# Patient Record
Sex: Male | Born: 2000 | Race: Black or African American | Hispanic: No | Marital: Single | Smoking: Never smoker
Health system: Southern US, Community
[De-identification: ages and names within clinical notes are randomized; demographics above are authoritative.]

## PROBLEM LIST (undated history)

## (undated) DIAGNOSIS — J309 Allergic rhinitis, unspecified: Secondary | ICD-10-CM

## (undated) DIAGNOSIS — F909 Attention-deficit hyperactivity disorder, unspecified type: Secondary | ICD-10-CM

## (undated) DIAGNOSIS — F411 Generalized anxiety disorder: Secondary | ICD-10-CM

## (undated) DIAGNOSIS — F840 Autistic disorder: Secondary | ICD-10-CM

## (undated) HISTORY — DX: Attention-deficit hyperactivity disorder, unspecified type: F90.9

## (undated) HISTORY — DX: Autistic disorder: F84.0

## (undated) HISTORY — DX: Generalized anxiety disorder: F41.1

## (undated) HISTORY — DX: Allergic rhinitis, unspecified: J30.9

---
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
## 2020-02-15 ENCOUNTER — Other Ambulatory Visit: Payer: Self-pay

## 2020-02-15 ENCOUNTER — Ambulatory Visit
Admission: EM | Admit: 2020-02-15 | Discharge: 2020-02-15 | Disposition: A | Discharge status: Home / Self Care | Payer: No Typology Code available for payment source | Attending: Family Medicine | Admitting: Family Medicine

## 2020-02-15 DIAGNOSIS — R220 Localized swelling, mass and lump, head: Secondary | ICD-10-CM

## 2020-02-15 DIAGNOSIS — K0889 Other specified disorders of teeth and supporting structures: Secondary | ICD-10-CM

## 2020-02-15 DIAGNOSIS — R6884 Jaw pain: Secondary | ICD-10-CM

## 2020-02-15 DIAGNOSIS — K047 Periapical abscess without sinus: Secondary | ICD-10-CM | POA: Diagnosis not present

## 2020-02-15 MED ORDER — AMOXICILLIN-POT CLAVULANATE 875-125 MG PO TABS
1.0000 | ORAL_TABLET | Freq: Two times a day (BID) | ORAL | 0 refills | Status: AC
Start: 2020-02-15 — End: 2020-02-25

## 2020-02-15 NOTE — ED Triage Notes (Signed)
Pt presents with sore in mouth that developed yesterday

## 2020-02-15 NOTE — ED Provider Notes (Signed)
[facility]   [id number] 02/15/20 Arrival Time: 1427  CC: DENTAL pain  SUBJECTIVE:  Joshua Gilmore is a 19 y.o. male who presents with dental pain for the last day. Reports that his wisdom teeth are coming in. Reports that there is right sided facial pain. Denies a precipitating event or trauma. Has tried orajel with no relief. Worse with chewing. Does see a dentist regularly. Denies similar symptoms in the past. Denies fever, chills, dysphagia, odynophagia, oral or neck swelling, nausea, vomiting, chest pain, SOB.    ROS: As per HPI.  All other pertinent ROS negative.     Past Medical History:  Diagnosis Date  . ADHD (attention deficit hyperactivity disorder)   . Allergic rhinitis   . Autism spectrum disorder   . Generalized anxiety disorder 05/07/2013   No past surgical history on file. No Known Allergies No current facility-administered medications on file prior to encounter.   Current Outpatient Medications on File Prior to Encounter  Medication Sig Dispense Refill  . cetirizine (ZYRTEC) 10 MG tablet Take 1 tablet (10 mg total) by mouth daily. 30 tablet 5  . fluticasone (FLONASE) 50 MCG/ACT nasal spray Place 1-2 sprays into both nostrils daily. 16 g 0  . ibuprofen (ADVIL) 600 MG tablet Take 1 tablet (600 mg total) by mouth every 6 (six) hours as needed. 30 tablet 0  . ketoconazole (NIZORAL) 2 % shampoo APPLY DAILY FOR 2 WEEKS, MAY PUT ON FACE LATHER AND RINSE.  0  . mupirocin ointment (BACTROBAN) 2 % Apply 1 application topically 2 (two) times daily. 30 g 0  . olopatadine (PATANOL) 0.1 % ophthalmic solution Place 1 drop into both eyes 2 (two) times daily. 5 mL 0  . triamcinolone lotion (KENALOG) 0.1 % Apply 1 application topically 2 (two) times daily. 240 mL 5   Social History   Socioeconomic History  . Marital status: Single    Spouse name: Not on file  . Number of children: Not on file  . Years of education: Not on file  . Highest education level: Not on  file  Occupational History  . Not on file  Tobacco Use  . Smoking status: Never Smoker  . Smokeless tobacco: Never Used  Substance and Sexual Activity  . Alcohol use: Not on file  . Drug use: Not on file  . Sexual activity: Not on file  Other Topics Concern  . Not on file  Social History Narrative   Lives with mom,sister away at college   Social Determinants of Health   Financial Resource Strain:   . Difficulty of Paying Living Expenses:   Food Insecurity:   . Worried About Programme researcher, broadcasting/film/video in the Last Year:   . Barista in the Last Year:   Transportation Needs:   . Freight forwarder (Medical):   Marland Kitchen Lack of Transportation (Non-Medical):   Physical Activity:   . Days of Exercise per Week:   . Minutes of Exercise per Session:   Stress:   . Feeling of Stress :   Social Connections:   . Frequency of Communication with Friends and Family:   . Frequency of Social Gatherings with Friends and Family:   . Attends Religious Services:   . Active Member of Clubs or Organizations:   . Attends Banker Meetings:   Marland Kitchen Marital Status:   Intimate Partner Violence:   . Fear of Current or Ex-Partner:   . Emotionally Abused:   Marland Kitchen Physically Abused:   .  Sexually Abused:    Family History  Problem Relation Age of Onset  . Hypertension Mother   . Learning disabilities Sister     OBJECTIVE:  Vitals:   02/15/20 1438  BP: 115/75  Pulse: 91  Resp: 20  Temp: 99.6 F (37.6 C)  SpO2: 96%    General appearance: alert; no distress HENT: normocephalic; atraumatic; dentition: good; good dentition over right lower gums without areas of fluctuance, Wisdom tooth to R lower jaw appears to be partially erupted Neck: supple without LAD Lungs: normal respirations Skin: warm and dry Psychological: alert and cooperative; normal mood and affect  ASSESSMENT & PLAN:  1. Dental infection   2. Jaw pain   3. Pain, dental   4. Right facial swelling     Meds ordered  this encounter  Medications  . amoxicillin-clavulanate (AUGMENTIN) 875-125 MG tablet    Sig: Take 1 tablet by mouth 2 (two) times daily for 10 days.    Dispense:  20 tablet    Refill:  0    Order Specific Question:   Supervising Provider    Answer:   Merrilee Jansky [id number]     Augmentin prescribed Take ibuprofen or tylenol for pain as needed.   Use as directed for pain relief Recommend soft diet until evaluated by dentist Maintain oral hygiene care Follow up with dentist as soon as possible for further evaluation and treatment  Return or go to the ED if you have any new or worsening symptoms such as fever, chills, difficulty swallowing, painful swallowing, oral or neck swelling, nausea, vomiting, chest pain, SOB.  Reviewed expectations re: course of current medical issues. Questions answered. Outlined signs and symptoms indicating need for more acute intervention. Patient verbalized understanding. After Visit Summary given.   Moshe Cipro, NP 02/15/20 1527

## 2020-02-15 NOTE — Discharge Instructions (Signed)
I have sent in Augmentin for you to take twice daily for 10 days  May take ibuprofen and/or tylenol as needed for pain  Follow up with the dentist  May need to have wisdom teeth extracted

## 2020-03-06 ENCOUNTER — Telehealth: Payer: Self-pay | Admitting: Licensed Clinical Social Worker

## 2020-03-06 NOTE — Telephone Encounter (Addendum)
Joshua Gilmore was able to complete this referral: disregard   Mom called stating she was told pt would need referral to be dropped before 3pm today in order for him to keep his appointment with [facility].

## 2020-03-25 ENCOUNTER — Ambulatory Visit: Payer: Self-pay | Admitting: Pediatrics

## 2020-04-23 ENCOUNTER — Other Ambulatory Visit: Payer: Self-pay

## 2020-04-23 ENCOUNTER — Ambulatory Visit (INDEPENDENT_AMBULATORY_CARE_PROVIDER_SITE_OTHER): Payer: No Typology Code available for payment source | Admitting: Internal Medicine

## 2020-04-23 ENCOUNTER — Encounter: Payer: Self-pay | Admitting: Internal Medicine

## 2020-04-23 VITALS — BP 118/76 | HR 67 | Temp 97.2°F | Resp 18 | Ht 70.0 in | Wt 145.1 lb

## 2020-04-23 DIAGNOSIS — F840 Autistic disorder: Secondary | ICD-10-CM | POA: Diagnosis not present

## 2020-04-23 DIAGNOSIS — Z23 Encounter for immunization: Secondary | ICD-10-CM | POA: Diagnosis not present

## 2020-04-23 DIAGNOSIS — G4700 Insomnia, unspecified: Secondary | ICD-10-CM | POA: Diagnosis not present

## 2020-04-23 DIAGNOSIS — L659 Nonscarring hair loss, unspecified: Secondary | ICD-10-CM | POA: Diagnosis not present

## 2020-04-23 DIAGNOSIS — F909 Attention-deficit hyperactivity disorder, unspecified type: Secondary | ICD-10-CM

## 2020-04-23 DIAGNOSIS — Z7689 Persons encountering health services in other specified circumstances: Secondary | ICD-10-CM | POA: Diagnosis not present

## 2020-04-23 MED ORDER — DERMA-SMOOTHE/FS SCALP 0.01 % EX OIL: TOPICAL_OIL | CUTANEOUS | 1 refills | Status: DC

## 2020-04-23 NOTE — Assessment & Plan Note (Signed)
Follows up with [facility] Last evaluation in 10/2019, paperchart reviewed Advised to continue to follow up with Psychology Patient is on speech therapy currently Patient is independent with daily functioning

## 2020-04-23 NOTE — Patient Instructions (Addendum)
You are given Flu vaccine in the office today. It is normal to have soreness in the area for about 2 days. Continue to use the arm to prevent stiffness.  You are advised to continue to follow up with Behavioral health specialist and speech therapy.  Please get blood tests done within a week.  You advised to perform moderate exercise, at least 150 mins/week.  Please maintain dark and non-noisy environment in the bedroom at nighttime for better sleep. Try to take dinner at least 2 hours before bedtime. Please avoid use of electronic devices at bedtime. Okay to take Melatonin 5 mg tablets at nighttime.

## 2020-04-23 NOTE — Progress Notes (Signed)
New Patient Office Visit  Subjective:  Patient ID: Joshua Gilmore, male    DOB: [birth year]  Age: 19 y.o. MRN: [id number]  CC:  Chief Complaint  Patient presents with  . New Patient (Initial Visit)    new pt no concerns just establishing care would like refill on derma- smooth     HPI Joshua Gilmore is a 19 year old male with PMH of autism spectrum disorder and alopecia presents for establishing care. He states that he has been in good health overall. He used to visit Pediatrician till now. He also visits [facility] for autism spectrum disorder. He is currently enrolled in speech therapy as well. He currently works in a group home. He has graduated from high school. He is requesting a refill of his derma smoothe oil, which was given to help with hair loss and states that he has had good hair growth with it.  He complains of insomnia, difficulty maintaining sleep. He denies nervousness or flashbacks.  She denies headache, dizziness, neck pain, chest pain, dyspnea, palpitations, nausea, vomiting, abdominal pain, constipation, diarrhea, or urinary problems.  Patient is up-to-date with vaccinations, including COVID vaccines. He had flu vaccine in the office today.  Past Medical History:  Diagnosis Date  . ADHD (attention deficit hyperactivity disorder)   . Allergic rhinitis   . Autism spectrum disorder   . Generalized anxiety disorder 05/07/2013    History reviewed. No pertinent surgical history.  Family History  Problem Relation Age of Onset  . Hypertension Mother   . Learning disabilities Sister     Social History   Socioeconomic History  . Marital status: Single    Spouse name: Not on file  . Number of children: Not on file  . Years of education: Not on file  . Highest education level: Not on file  Occupational History  . 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 Smoking status: Never Smoker  . Smokeless tobacco: Never Used  Substance  and Sexual Activity  . Alcohol use: Never  . Drug use: Never  . Sexual activity: Not on file  Other Topics Concern  . Not on file  Social History Narrative   Lives with mom,sister away at college   Social Determinants of Health   Financial Resource Strain: Low Risk   . Difficulty of Paying Living Expenses: Not hard at all  Food Insecurity: No Food Insecurity  . Worried About Programme researcher, broadcasting/film/video in the Last Year: Never true  . Ran Out of Food in the Last Year: Never true  Transportation Needs: No Transportation Needs  . Lack of Transportation (Medical): No  . Lack of Transportation (Non-Medical): No  Physical Activity: Insufficiently Active  . Days of Exercise per Week: 3 days  . Minutes of Exercise per Session: 20 min  Stress: No Stress Concern Present  . Feeling of Stress : Not at all  Social Connections: Socially Isolated  . Frequency of Communication with Friends and Family: More than three times a week  . Frequency of Social Gatherings with Friends and Family: More than three times a week  . Attends Religious Services: Never  . Active Member of Clubs or Organizations: No  . Attends Banker Meetings: Never  . Marital Status: Never married  Intimate Partner Violence: Not At Risk  . Fear of Current or Ex-Partner: No  . Emotionally Abused: No  . Physically Abused: No  . Sexually Abused: No    ROS Review of  Systems  Constitutional: Negative for chills and fever.  HENT: Negative for congestion, postnasal drip, sinus pressure, sinus pain and sore throat.   Eyes: Negative for pain and discharge.  Respiratory: Negative for cough and shortness of breath.   Cardiovascular: Negative for chest pain and palpitations.  Gastrointestinal: Negative for constipation, diarrhea, nausea and vomiting.  Endocrine: Negative for polydipsia and polyuria.  Genitourinary: Negative for dysuria and hematuria.  Musculoskeletal: Negative for neck pain and neck stiffness.  Skin:  Negative for rash.  Neurological: Negative for dizziness, weakness, numbness and headaches.  Psychiatric/Behavioral: Negative for agitation and behavioral problems.    Objective:   Today's Vitals: BP 118/76 (BP Location: Right Arm, Patient Position: Sitting, Cuff Size: Normal)   Pulse 67   Temp (!) 97.2 F (36.2 C) (Temporal)   Resp 18   Ht 5\' 10"  (1.778 m)   Wt 145 lb 1.9 oz (65.8 kg)   SpO2 98%   BMI 20.82 kg/m   Physical Exam Vitals reviewed.  Constitutional:      General: He is not in acute distress.    Appearance: He is not diaphoretic.  HENT:     Head: Normocephalic and atraumatic.     Nose: Nose normal.     Mouth/Throat:     Mouth: Mucous membranes are moist.  Eyes:     General: No scleral icterus.    Extraocular Movements: Extraocular movements intact.     Pupils: Pupils are equal, round, and reactive to light.  Cardiovascular:     Rate and Rhythm: Normal rate and regular rhythm.     Pulses: Normal pulses.     Heart sounds: No murmur heard.   Pulmonary:     Breath sounds: Normal breath sounds. No wheezing or rales.  Abdominal:     Palpations: Abdomen is soft.     Tenderness: There is no abdominal tenderness.  Musculoskeletal:     Cervical back: Neck supple. No tenderness.     Right lower leg: No edema.     Left lower leg: No edema.  Skin:    General: Skin is warm.     Findings: No rash.  Neurological:     General: No focal deficit present.     Mental Status: He is alert and oriented to person, place, and time.     Cranial Nerves: No cranial nerve deficit.     Sensory: No sensory deficit.     Motor: No weakness.  Psychiatric:        Mood and Affect: Mood normal.        Behavior: Behavior normal.     Assessment & Plan:   Encounter to establish care Care established Previous chart reviewed History and medications reviewed with the patient  Autism spectrum disorder Follows up with [facility] Last evaluation in 10/2019, paperchart  reviewed Advised to continue to follow up with Psychology Patient is on speech therapy currently Patient is independent with daily functioning  ADHD Not on any pharmacotherapy Follows with [facility]  Alopecia Had patches of hair loss in the past, uses Derma smoothe oil, states that his hair growth is better with it Could be a sign of atopic dermatitis in the past, states he has history of seasonal allergies  Insomnia Discussed behavioral modification to help with sleep Material provided     Other Visit Diagnoses    Need for immunization against influenza    -  Primary   Relevant Orders   Flu Vaccine QUAD 36+ mos IM (Completed)  Outpatient Encounter Medications as of 04/23/2020  Medication Sig  . DERMA-SMOOTHE/FS SCALP 0.01 % OIL Apply topically at nighttime.  . [DISCONTINUED] DERMA-SMOOTHE/FS SCALP 0.01 % OIL Apply topically at bedtime as needed.  . [DISCONTINUED] cetirizine (ZYRTEC) 10 MG tablet Take 1 tablet (10 mg total) by mouth daily. (Patient not taking: Reported on 04/23/2020)  . [DISCONTINUED] fluticasone (FLONASE) 50 MCG/ACT nasal spray Place 1-2 sprays into both nostrils daily. (Patient not taking: Reported on 04/23/2020)  . [DISCONTINUED] ibuprofen (ADVIL) 600 MG tablet Take 1 tablet (600 mg total) by mouth every 6 (six) hours as needed. (Patient not taking: Reported on 04/23/2020)  . [DISCONTINUED] ketoconazole (NIZORAL) 2 % shampoo APPLY DAILY FOR 2 WEEKS, MAY PUT ON FACE LATHER AND RINSE. (Patient not taking: Reported on 04/23/2020)  . [DISCONTINUED] mupirocin ointment (BACTROBAN) 2 % Apply 1 application topically 2 (two) times daily. (Patient not taking: Reported on 04/23/2020)  . [DISCONTINUED] olopatadine (PATANOL) 0.1 % ophthalmic solution Place 1 drop into both eyes 2 (two) times daily. (Patient not taking: Reported on 04/23/2020)  . [DISCONTINUED] triamcinolone lotion (KENALOG) 0.1 % Apply 1 application topically 2 (two) times daily. (Patient not taking:  Reported on 04/23/2020)   No facility-administered encounter medications on file as of 04/23/2020.    Follow-up: Return in about 6 months (around 10/22/2020).   Anabel Halon, MD

## 2020-04-23 NOTE — Assessment & Plan Note (Signed)
Care established Previous chart reviewed History and medications reviewed with the patient 

## 2020-04-23 NOTE — Assessment & Plan Note (Signed)
Had patches of hair loss in the past, uses Derma smoothe oil, states that his hair growth is better with it Could be a sign of atopic dermatitis in the past, states he has history of seasonal allergies

## 2020-04-23 NOTE — Assessment & Plan Note (Signed)
Discussed behavioral modification to help with sleep Material provided

## 2020-04-23 NOTE — Assessment & Plan Note (Signed)
Not on any pharmacotherapy Follows with [facility]

## 2020-05-03 LAB — CBC WITH DIFFERENTIAL/PLATELET
Basophils Absolute: 0 10*3/uL (ref 0.0–0.2)
Basos: 1 %
EOS (ABSOLUTE): 0.1 10*3/uL (ref 0.0–0.4)
Eos: 2 %
Hematocrit: 47.7 % (ref 37.5–51.0)
Hemoglobin: 14.9 g/dL (ref 13.0–17.7)
Immature Grans (Abs): 0 10*3/uL (ref 0.0–0.1)
Immature Granulocytes: 0 %
Lymphocytes Absolute: 1.1 10*3/uL (ref 0.7–3.1)
Lymphs: 28 %
MCH: 25.8 pg — ABNORMAL LOW (ref 26.6–33.0)
MCHC: 31.2 g/dL — ABNORMAL LOW (ref 31.5–35.7)
MCV: 83 fL (ref 79–97)
Monocytes Absolute: 0.3 10*3/uL (ref 0.1–0.9)
Monocytes: 7 %
Neutrophils Absolute: 2.5 10*3/uL (ref 1.4–7.0)
Neutrophils: 62 %
Platelets: 353 10*3/uL (ref 150–450)
RBC: 5.78 x10E6/uL (ref 4.14–5.80)
RDW: 13.3 % (ref 11.6–15.4)
WBC: 4 10*3/uL (ref 3.4–10.8)

## 2020-05-03 LAB — LIPID PANEL
Chol/HDL Ratio: 2.6 ratio (ref 0.0–5.0)
Cholesterol, Total: 160 mg/dL (ref 100–169)
HDL: 62 mg/dL (ref >39)
LDL Chol Calc (NIH): 87 mg/dL (ref 0–109)
Triglycerides: 52 mg/dL (ref 0–89)
VLDL Cholesterol Cal: 11 mg/dL (ref 5–40)

## 2020-05-03 LAB — CMP14+EGFR
ALT: 16 IU/L (ref 0–44)
AST: 22 IU/L (ref 0–40)
Albumin/Globulin Ratio: 2 (ref 1.2–2.2)
Albumin: 5.1 g/dL (ref 4.1–5.2)
Alkaline Phosphatase: 74 IU/L (ref 51–125)
BUN/Creatinine Ratio: 12 (ref 9–20)
BUN: 12 mg/dL (ref 6–20)
Bilirubin Total: 1.5 mg/dL — ABNORMAL HIGH (ref 0.0–1.2)
CO2: 25 mmol/L (ref 20–29)
Calcium: 10.1 mg/dL (ref 8.7–10.2)
Chloride: 101 mmol/L (ref 96–106)
Creatinine, Ser: 0.98 mg/dL (ref 0.76–1.27)
GFR calc Af Amer: 129 mL/min/{1.73_m2} (ref >59)
GFR calc non Af Amer: 111 mL/min/{1.73_m2} (ref >59)
Globulin, Total: 2.6 g/dL (ref 1.5–4.5)
Glucose: 93 mg/dL (ref 65–99)
Potassium: 4.5 mmol/L (ref 3.5–5.2)
Sodium: 141 mmol/L (ref 134–144)
Total Protein: 7.7 g/dL (ref 6.0–8.5)

## 2020-05-03 LAB — T4 AND TSH
T4, Total: 6.9 ug/dL (ref 4.5–12.0)
TSH: 1.1 u[IU]/mL (ref 0.450–4.500)

## 2020-08-22 ENCOUNTER — Ambulatory Visit (INDEPENDENT_AMBULATORY_CARE_PROVIDER_SITE_OTHER): Payer: No Typology Code available for payment source | Admitting: Internal Medicine

## 2020-08-22 ENCOUNTER — Other Ambulatory Visit: Payer: Self-pay

## 2020-08-22 ENCOUNTER — Encounter: Payer: Self-pay | Admitting: Internal Medicine

## 2020-08-22 VITALS — BP 139/81 | HR 62 | Temp 97.8°F | Ht 70.0 in | Wt 149.0 lb

## 2020-08-22 DIAGNOSIS — S46911A Strain of unspecified muscle, fascia and tendon at shoulder and upper arm level, right arm, initial encounter: Secondary | ICD-10-CM

## 2020-08-22 DIAGNOSIS — M25511 Pain in right shoulder: Secondary | ICD-10-CM

## 2020-08-22 MED ORDER — IBUPROFEN 600 MG PO TABS: 600.0000 mg | ORAL_TABLET | Freq: Three times a day (TID) | ORAL | 0 refills | Status: DC | PRN

## 2020-08-22 MED ORDER — KETOROLAC TROMETHAMINE 60 MG/2ML IM SOLN
60.0000 mg | Freq: Once | INTRAMUSCULAR | Status: AC
  Administered 2020-08-22: 60 mg via INTRAMUSCULAR

## 2020-08-22 NOTE — Progress Notes (Signed)
Acute Office Visit  Subjective:    Patient ID: Joshua Gilmore, male    DOB: [birth year], 20 y.o.   MRN: [id number]  Chief Complaint  Patient presents with  . Shoulder Pain    R shoulder pain, x3-4 days, no known injury.    HPI Patient is in today for evaluation of right shoulder pain, which started about 2 days ago. He states that he woke up with right shoulder pain, which is 7-8/10, constant and radiates to his arm. He has difficulty lifting arm beyond 90 degrees. Denies any recent injury. Denies numbness or tingling in the UE. Denies neck pain.  Past Medical History:  Diagnosis Date  . ADHD (attention deficit hyperactivity disorder)   . Allergic rhinitis   . Autism spectrum disorder   . Generalized anxiety disorder 05/07/2013    No past surgical history on file.  Family History  Problem Relation Age of Onset  . Hypertension Mother   . Learning disabilities Sister     Social History   Socioeconomic History  . Marital status: Single    Spouse name: Not on file  . Number of children: Not on file  . Years of education: Not on file  . Highest education level: Not on file  Occupational History  . 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 Smoking status: Never Smoker  . Smokeless tobacco: Never Used  Substance and Sexual Activity  . Alcohol use: Never  . Drug use: Never  . Sexual activity: Not on file  Other Topics Concern  . Not on file  Social History Narrative   Lives with mom,sister away at college   Social Determinants of Health   Financial Resource Strain: Low Risk   . Difficulty of Paying Living Expenses: Not hard at all  Food Insecurity: No Food Insecurity  . Worried About Programme researcher, broadcasting/film/video in the Last Year: Never true  . Ran Out of Food in the Last Year: Never true  Transportation Needs: No Transportation Needs  . Lack of Transportation (Medical): No  . Lack of Transportation (Non-Medical): No  Physical Activity:  Insufficiently Active  . Days of Exercise per Week: 3 days  . Minutes of Exercise per Session: 20 min  Stress: No Stress Concern Present  . Feeling of Stress : Not at all  Social Connections: Socially Isolated  . Frequency of Communication with Friends and Family: More than three times a week  . Frequency of Social Gatherings with Friends and Family: More than three times a week  . Attends Religious Services: Never  . Active Member of Clubs or Organizations: No  . Attends Banker Meetings: Never  . Marital Status: Never married  Intimate Partner Violence: Not At Risk  . Fear of Current or Ex-Partner: No  . Emotionally Abused: No  . Physically Abused: No  . Sexually Abused: No    Outpatient Medications Prior to Visit  Medication Sig Dispense Refill  . DERMA-SMOOTHE/FS SCALP 0.01 % OIL Apply topically at nighttime. 118 mL 1   No facility-administered medications prior to visit.    No Known Allergies  Review of Systems  Constitutional: Negative for chills and fever.  Musculoskeletal: Negative for neck pain and neck stiffness.       Right shoulder pain  Neurological: Negative for weakness and numbness.       Objective:    Physical Exam Constitutional:      General: He is not in acute distress. HENT:  Head: Normocephalic and atraumatic.  Musculoskeletal:        General: No tenderness.     Comments: Painful ROM of right shoulder beyond 90 degrees  Neurological:     General: No focal deficit present.     Mental Status: He is alert and oriented to person, place, and time.     BP 139/81 (BP Location: Right Arm, Patient Position: Sitting, Cuff Size: Normal)   Pulse 62   Temp 97.8 F (36.6 C) (Temporal)   Ht 5\' 10"  (1.778 m)   Wt 149 lb (67.6 kg)   SpO2 96%   BMI 21.38 kg/m  Wt Readings from Last 3 Encounters:  08/22/20 149 lb (67.6 kg) (42 %, Z= -0.20)*  04/23/20 145 lb 1.9 oz (65.8 kg) (37 %, Z= -0.33)*  12/11/19 142 lb 2 oz (64.5 kg) (34 %, Z=  -0.40)*   * Growth percentiles are based on CDC (Boys, 2-20 Years) data.    Health Maintenance Due  Topic Date Due  . Hepatitis C Screening  Never done    There are no preventive care reminders to display for this patient.   Lab Results  Component Value Date   TSH 1.100 05/02/2020   Lab Results  Component Value Date   WBC 4.0 05/02/2020   HGB 14.9 05/02/2020   HCT 47.7 05/02/2020   MCV 83 05/02/2020   PLT 353 05/02/2020   Lab Results  Component Value Date   NA 141 05/02/2020   K 4.5 05/02/2020   CO2 25 05/02/2020   GLUCOSE 93 05/02/2020   BUN 12 05/02/2020   CREATININE 0.98 05/02/2020   BILITOT 1.5 (H) 05/02/2020   ALKPHOS 74 05/02/2020   AST 22 05/02/2020   ALT 16 05/02/2020   PROT 7.7 05/02/2020   ALBUMIN 5.1 05/02/2020   CALCIUM 10.1 05/02/2020   Lab Results  Component Value Date   CHOL 160 05/02/2020   Lab Results  Component Value Date   HDL 62 05/02/2020   Lab Results  Component Value Date   LDLCALC 87 05/02/2020   Lab Results  Component Value Date   TRIG 52 05/02/2020   Lab Results  Component Value Date   CHOLHDL 2.6 05/02/2020   No results found for: HGBA1C     Assessment & Plan:   Problem List Items Addressed This Visit   None   Visit Diagnoses    Acute pain of right shoulder    -  Primary No recent injury Appears to be muscle strain   Muscle strain, shoulder region, right, initial encounter     Toradol given Ibuprofen for short-term Heating pad or ice for pain/swelling  Work note provided.       No orders of the defined types were placed in this encounter.    05/04/2020, MD

## 2020-08-22 NOTE — Patient Instructions (Signed)
Please apply heating pads or ice over shoulder region to help with pain/swelling.  Please take Ibuprofen as prescribed for shoulder pain.

## 2020-09-01 ENCOUNTER — Encounter: Payer: Self-pay | Admitting: Internal Medicine

## 2020-09-01 ENCOUNTER — Other Ambulatory Visit: Payer: Self-pay

## 2020-09-01 ENCOUNTER — Ambulatory Visit (INDEPENDENT_AMBULATORY_CARE_PROVIDER_SITE_OTHER): Payer: No Typology Code available for payment source | Admitting: Internal Medicine

## 2020-09-01 VITALS — BP 136/75 | HR 67 | Temp 98.1°F | Resp 18 | Ht 71.0 in | Wt 151.4 lb

## 2020-09-01 DIAGNOSIS — H6092 Unspecified otitis externa, left ear: Secondary | ICD-10-CM | POA: Diagnosis not present

## 2020-09-01 DIAGNOSIS — L659 Nonscarring hair loss, unspecified: Secondary | ICD-10-CM

## 2020-09-01 MED ORDER — CARBAMIDE PEROXIDE 6.5 % OT SOLN: 5.0000 [drp] | Freq: Two times a day (BID) | OTIC | 0 refills | Status: DC

## 2020-09-01 MED ORDER — CIPROFLOXACIN-DEXAMETHASONE 0.3-0.1 % OT SUSP: 4.0000 [drp] | Freq: Two times a day (BID) | OTIC | 0 refills | Status: DC

## 2020-09-01 NOTE — Patient Instructions (Signed)
Please apply ear drops as prescribed.  Please avoid using any sharp objects to clean ear wax.  Okay to clean with warm water.

## 2020-09-01 NOTE — Progress Notes (Signed)
Acute Office Visit  Subjective:    Patient ID: Joshua Gilmore, male    DOB: 01-12-2001, 20 y.o.   MRN: [id number]  Chief Complaint  Patient presents with  . Ear Pain    Left ear stopped up has been stopped up for about 2 days thinks its wax build up     HPI Patient is in today for evaluation of left ear fullness and yellowish discharge along with left ear pain. He denies any hearing difficulty, tinnitus, pain behind the left ear, or imbalance. He denies any injury or inserting any sharp objects into left ear. Denies any fever or chills. Denies nasal congestion, sinus pain/pressure or sore throat.  Past Medical History:  Diagnosis Date  . ADHD (attention deficit hyperactivity disorder)   . Allergic rhinitis   . Autism spectrum disorder   . Generalized anxiety disorder 05/07/2013    History reviewed. No pertinent surgical history.  Family History  Problem Relation Age of Onset  . Hypertension Mother   . Learning disabilities Sister     Social History   Socioeconomic History  . Marital status: Single    Spouse name: Not on file  . Number of children: Not on file  . Years of education: Not on file  . Highest education level: Not on file  Occupational History  . 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 Smoking status: Never Smoker  . Smokeless tobacco: Never Used  Substance and Sexual Activity  . Alcohol use: Never  . Drug use: Never  . Sexual activity: Not on file  Other Topics Concern  . Not on file  Social History Narrative   Lives with mom,sister away at college   Social Determinants of Health   Financial Resource Strain: Low Risk   . Difficulty of Paying Living Expenses: Not hard at all  Food Insecurity: No Food Insecurity  . Worried About Programme researcher, broadcasting/film/video in the Last Year: Never true  . Ran Out of Food in the Last Year: Never true  Transportation Needs: No Transportation Needs  . Lack of Transportation (Medical): No  . Lack  of Transportation (Non-Medical): No  Physical Activity: Insufficiently Active  . Days of Exercise per Week: 3 days  . Minutes of Exercise per Session: 20 min  Stress: No Stress Concern Present  . Feeling of Stress : Not at all  Social Connections: Socially Isolated  . Frequency of Communication with Friends and Family: More than three times a week  . Frequency of Social Gatherings with Friends and Family: More than three times a week  . Attends Religious Services: Never  . Active Member of Clubs or Organizations: No  . Attends Banker Meetings: Never  . Marital Status: Never married  Intimate Partner Violence: Not At Risk  . Fear of Current or Ex-Partner: No  . Emotionally Abused: No  . Physically Abused: No  . Sexually Abused: No    Outpatient Medications Prior to Visit  Medication Sig Dispense Refill  . DERMA-SMOOTHE/FS SCALP 0.01 % OIL Apply topically at nighttime. 118 mL 1  . ibuprofen (ADVIL) 600 MG tablet Take 1 tablet (600 mg total) by mouth every 8 (eight) hours as needed. 20 tablet 0   No facility-administered medications prior to visit.    No Known Allergies  Review of Systems  Constitutional: Negative for chills and fever.  HENT: Positive for ear discharge and ear pain. Negative for congestion and sore throat.   Eyes:  Negative for pain and discharge.  Respiratory: Negative for cough and shortness of breath.   Cardiovascular: Negative for chest pain and palpitations.  Musculoskeletal: Negative for neck pain and neck stiffness.  Skin: Negative for rash.  Neurological: Negative for dizziness, weakness, numbness and headaches.  Psychiatric/Behavioral: Negative for agitation and behavioral problems.       Objective:    Physical Exam Vitals reviewed.  Constitutional:      General: He is not in acute distress.    Appearance: He is not diaphoretic.  HENT:     Head: Normocephalic and atraumatic.     Right Ear: Tympanic membrane, ear canal and  external ear normal. There is no impacted cerumen.     Ears:     Comments: Left ear canal erythematous, with excess cerumen Tenderness over tragal area    Nose: Nose normal.     Mouth/Throat:     Mouth: Mucous membranes are moist.  Eyes:     General: No scleral icterus.    Extraocular Movements: Extraocular movements intact.     Pupils: Pupils are equal, round, and reactive to light.  Cardiovascular:     Rate and Rhythm: Normal rate and regular rhythm.     Heart sounds: Normal heart sounds. No murmur heard.   Abdominal:     Palpations: Abdomen is soft.  Musculoskeletal:     Cervical back: Neck supple. No tenderness.  Skin:    General: Skin is warm.     Findings: No rash.  Neurological:     General: No focal deficit present.     Mental Status: He is alert and oriented to person, place, and time.  Psychiatric:        Mood and Affect: Mood normal.        Behavior: Behavior normal.     BP 136/75 (BP Location: Right Arm, Patient Position: Sitting, Cuff Size: Normal)   Pulse 67   Temp 98.1 F (36.7 C) (Oral)   Resp 18   Ht 5\' 11"  (1.803 m)   Wt 151 lb 6.4 oz (68.7 kg)   SpO2 99%   BMI 21.12 kg/m  Wt Readings from Last 3 Encounters:  09/01/20 151 lb 6.4 oz (68.7 kg) (46 %, Z= -0.11)*  08/22/20 149 lb (67.6 kg) (42 %, Z= -0.20)*  04/23/20 145 lb 1.9 oz (65.8 kg) (37 %, Z= -0.33)*   * Growth percentiles are based on CDC (Boys, 2-20 Years) data.    Health Maintenance Due  Topic Date Due  . Hepatitis C Screening  Never done    There are no preventive care reminders to display for this patient.   Lab Results  Component Value Date   TSH 1.100 05/02/2020   Lab Results  Component Value Date   WBC 4.0 05/02/2020   HGB 14.9 05/02/2020   HCT 47.7 05/02/2020   MCV 83 05/02/2020   PLT 353 05/02/2020   Lab Results  Component Value Date   NA 141 05/02/2020   K 4.5 05/02/2020   CO2 25 05/02/2020   GLUCOSE 93 05/02/2020   BUN 12 05/02/2020   CREATININE 0.98  05/02/2020   BILITOT 1.5 (H) 05/02/2020   ALKPHOS 74 05/02/2020   AST 22 05/02/2020   ALT 16 05/02/2020   PROT 7.7 05/02/2020   ALBUMIN 5.1 05/02/2020   CALCIUM 10.1 05/02/2020   Lab Results  Component Value Date   CHOL 160 05/02/2020   Lab Results  Component Value Date   HDL 62 05/02/2020  Lab Results  Component Value Date   LDLCALC 87 05/02/2020   Lab Results  Component Value Date   TRIG 52 05/02/2020   Lab Results  Component Value Date   CHOLHDL 2.6 05/02/2020   No results found for: HGBA1C     Assessment & Plan:   Problem List Items Addressed This Visit   None   Visit Diagnoses    Otitis externa of left ear, unspecified chronicity, unspecified type    -  Primary Ear drops prescribed Advised to clean ears with warm water only, avoid inserting any objects   Relevant Medications   carbamide peroxide (DEBROX) 6.5 % OTIC solution   ciprofloxacin-dexamethasone (CIPRODEX) OTIC suspension   Hair loss    Prefers a referral to Dermatology    Relevant Orders   Ambulatory referral to Dermatology       Meds ordered this encounter  Medications  . carbamide peroxide (DEBROX) 6.5 % OTIC solution    Sig: Place 5 drops into the left ear 2 (two) times daily.    Dispense:  15 mL    Refill:  0  . ciprofloxacin-dexamethasone (CIPRODEX) OTIC suspension    Sig: Place 4 drops into the left ear 2 (two) times daily.    Dispense:  7.5 mL    Refill:  0     Khalia Gong Concha Se, MD

## 2020-10-22 ENCOUNTER — Encounter: Payer: Self-pay | Admitting: Internal Medicine

## 2020-10-22 ENCOUNTER — Ambulatory Visit (INDEPENDENT_AMBULATORY_CARE_PROVIDER_SITE_OTHER): Payer: No Typology Code available for payment source | Admitting: Internal Medicine

## 2020-10-22 ENCOUNTER — Other Ambulatory Visit: Payer: Self-pay

## 2020-10-22 VITALS — BP 129/78 | HR 71 | Temp 99.2°F | Resp 16 | Ht 72.0 in | Wt 145.1 lb

## 2020-10-22 DIAGNOSIS — F840 Autistic disorder: Secondary | ICD-10-CM | POA: Diagnosis not present

## 2020-10-22 DIAGNOSIS — Z1159 Encounter for screening for other viral diseases: Secondary | ICD-10-CM | POA: Diagnosis not present

## 2020-10-22 DIAGNOSIS — Z0000 Encounter for general adult medical examination without abnormal findings: Secondary | ICD-10-CM | POA: Diagnosis not present

## 2020-10-22 DIAGNOSIS — L659 Nonscarring hair loss, unspecified: Secondary | ICD-10-CM | POA: Diagnosis not present

## 2020-10-22 NOTE — Patient Instructions (Signed)

## 2020-10-22 NOTE — Progress Notes (Signed)
Established Patient Office Visit  Subjective:  Patient ID: Joshua Gilmore, male    DOB: Aug 05, 2000  Age: 20 y.o. MRN: [id number]  CC:  Chief Complaint  Patient presents with  . Follow-up    6 month follow up     HPI Joshua Gilmore  is a 20 year old male with PMH of autism spectrum disorder and alopecia who presents for follow up of alopecia.  He states that his hair growth has been better with Derma-smoothe oil. He denies any itching or patches of hair loss currently.  He states that he still follows up with Psychologist.  Past Medical History:  Diagnosis Date  . ADHD (attention deficit hyperactivity disorder)   . Allergic rhinitis   . Autism spectrum disorder   . Generalized anxiety disorder 05/07/2013    History reviewed. No pertinent surgical history.  Family History  Problem Relation Age of Onset  . Hypertension Mother   . Learning disabilities Sister     Social History   Socioeconomic History  . Marital status: Single    Spouse name: Not on file  . Number of children: Not on file  . Years of education: Not on file  . Highest education level: Not on file  Occupational History  . 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 Smoking status: Never Smoker  . Smokeless tobacco: Never Used  Substance and Sexual Activity  . Alcohol use: Never  . Drug use: Never  . Sexual activity: Not on file  Other Topics Concern  . Not on file  Social History Narrative   Lives with mom,sister away at college   Social Determinants of Health   Financial Resource Strain: Low Risk   . Difficulty of Paying Living Expenses: Not hard at all  Food Insecurity: No Food Insecurity  . Worried About Charity fundraiser in the Last Year: Never true  . Ran Out of Food in the Last Year: Never true  Transportation Needs: No Transportation Needs  . Lack of Transportation (Medical): No  . Lack of Transportation (Non-Medical): No  Physical Activity:  Insufficiently Active  . Days of Exercise per Week: 3 days  . Minutes of Exercise per Session: 20 min  Stress: No Stress Concern Present  . Feeling of Stress : Not at all  Social Connections: Socially Isolated  . Frequency of Communication with Friends and Family: More than three times a week  . Frequency of Social Gatherings with Friends and Family: More than three times a week  . Attends Religious Services: Never  . Active Member of Clubs or Organizations: No  . Attends Archivist Meetings: Never  . Marital Status: Never married  Intimate Partner Violence: Not At Risk  . Fear of Current or Ex-Partner: No  . Emotionally Abused: No  . Physically Abused: No  . Sexually Abused: No    Outpatient Medications Prior to Visit  Medication Sig Dispense Refill  . carbamide peroxide (DEBROX) 6.5 % OTIC solution Place 5 drops into the left ear 2 (two) times daily. 15 mL 0  . DERMA-SMOOTHE/FS SCALP 0.01 % OIL Apply topically at nighttime. 118 mL 1  . ibuprofen (ADVIL) 600 MG tablet Take 1 tablet (600 mg total) by mouth every 8 (eight) hours as needed. 20 tablet 0  . ciprofloxacin-dexamethasone (CIPRODEX) OTIC suspension Place 4 drops into the left ear 2 (two) times daily. 7.5 mL 0   No facility-administered medications prior to visit.    No Known Allergies  ROS Review of Systems  Constitutional: Negative for chills and fever.  HENT: Negative for congestion, postnasal drip, sinus pressure, sinus pain and sore throat.   Eyes: Negative for pain and discharge.  Respiratory: Negative for cough and shortness of breath.   Cardiovascular: Negative for chest pain and palpitations.  Gastrointestinal: Negative for constipation, diarrhea, nausea and vomiting.  Endocrine: Negative for polydipsia and polyuria.  Genitourinary: Negative for dysuria and hematuria.  Musculoskeletal: Negative for neck pain and neck stiffness.  Skin: Negative for rash.  Neurological: Negative for dizziness,  weakness, numbness and headaches.  Psychiatric/Behavioral: Negative for agitation and behavioral problems.      Objective:    Physical Exam Vitals reviewed.  Constitutional:      General: He is not in acute distress.    Appearance: He is not diaphoretic.  HENT:     Head: Normocephalic and atraumatic.     Nose: Nose normal.     Mouth/Throat:     Mouth: Mucous membranes are moist.  Eyes:     General: No scleral icterus.    Extraocular Movements: Extraocular movements intact.  Cardiovascular:     Rate and Rhythm: Normal rate and regular rhythm.     Pulses: Normal pulses.     Heart sounds: Normal heart sounds. No murmur heard.   Pulmonary:     Breath sounds: Normal breath sounds. No wheezing or rales.  Musculoskeletal:     Cervical back: Neck supple. No tenderness.     Right lower leg: No edema.     Left lower leg: No edema.  Skin:    General: Skin is warm.     Findings: No rash.  Neurological:     General: No focal deficit present.     Mental Status: He is alert and oriented to person, place, and time.  Psychiatric:        Mood and Affect: Mood normal.        Behavior: Behavior normal.     BP 129/78 (BP Location: Right Arm, Patient Position: Sitting, Cuff Size: Normal)   Pulse 71   Temp 99.2 F (37.3 C) (Oral)   Resp 16   Ht 6' (1.829 m)   Wt 145 lb 1.9 oz (65.8 kg)   SpO2 98%   BMI 19.68 kg/m  Wt Readings from Last 3 Encounters:  10/22/20 145 lb 1.9 oz (65.8 kg) (34 %, Z= -0.40)*  09/01/20 151 lb 6.4 oz (68.7 kg) (46 %, Z= -0.11)*  08/22/20 149 lb (67.6 kg) (42 %, Z= -0.20)*   * Growth percentiles are based on CDC (Boys, 2-20 Years) data.     Health Maintenance Due  Topic Date Due  . Hepatitis C Screening  Never done    There are no preventive care reminders to display for this patient.  Lab Results  Component Value Date   TSH 1.100 05/02/2020   Lab Results  Component Value Date   WBC 4.0 05/02/2020   HGB 14.9 05/02/2020   HCT 47.7  05/02/2020   MCV 83 05/02/2020   PLT 353 05/02/2020   Lab Results  Component Value Date   NA 141 05/02/2020   K 4.5 05/02/2020   CO2 25 05/02/2020   GLUCOSE 93 05/02/2020   BUN 12 05/02/2020   CREATININE 0.98 05/02/2020   BILITOT 1.5 (H) 05/02/2020   ALKPHOS 74 05/02/2020   AST 22 05/02/2020   ALT 16 05/02/2020   PROT 7.7 05/02/2020   ALBUMIN 5.1 05/02/2020   CALCIUM 10.1 05/02/2020  Lab Results  Component Value Date   CHOL 160 05/02/2020   Lab Results  Component Value Date   HDL 62 05/02/2020   Lab Results  Component Value Date   LDLCALC 87 05/02/2020   Lab Results  Component Value Date   TRIG 52 05/02/2020   Lab Results  Component Value Date   CHOLHDL 2.6 05/02/2020   No results found for: HGBA1C    Assessment & Plan:   Problem List Items Addressed This Visit      Musculoskeletal and Integument   Alopecia - Primary    Had patches of hair loss in the past, uses Derma smoothe oil, states that his hair growth is better with it      Relevant Orders   CBC   CMP14+EGFR   TSH     Other   Autism spectrum disorder    Follows up with [facility] Advised to continue to follow up with Psychology Patient is on speech therapy currently Patient is independent with daily functioning       Other Visit Diagnoses    Need for hepatitis C screening test       Relevant Orders   Hepatitis C Antibody    No orders of the defined types were placed in this encounter.   Follow-up: Return in about 6 months (around 04/23/2021) for Annual physical.    Lindell Spar, MD

## 2020-10-22 NOTE — Assessment & Plan Note (Signed)
Had patches of hair loss in the past, uses Derma smoothe oil, states that his hair growth is better with it

## 2020-10-22 NOTE — Assessment & Plan Note (Signed)
Follows up with [facility] Advised to continue to follow up with Psychology Patient is on speech therapy currently Patient is independent with daily functioning

## 2020-12-10 ENCOUNTER — Encounter: Payer: Self-pay | Admitting: Internal Medicine

## 2020-12-10 ENCOUNTER — Ambulatory Visit (INDEPENDENT_AMBULATORY_CARE_PROVIDER_SITE_OTHER): Payer: No Typology Code available for payment source | Admitting: Internal Medicine

## 2020-12-10 ENCOUNTER — Other Ambulatory Visit: Payer: Self-pay

## 2020-12-10 VITALS — BP 135/84 | HR 79 | Temp 98.9°F | Resp 18 | Ht 72.0 in | Wt 144.1 lb

## 2020-12-10 DIAGNOSIS — M545 Low back pain, unspecified: Secondary | ICD-10-CM | POA: Diagnosis not present

## 2020-12-10 DIAGNOSIS — J302 Other seasonal allergic rhinitis: Secondary | ICD-10-CM | POA: Diagnosis not present

## 2020-12-10 MED ORDER — CYCLOBENZAPRINE HCL 5 MG PO TABS: 5.0000 mg | ORAL_TABLET | Freq: Three times a day (TID) | ORAL | 1 refills | Status: DC | PRN

## 2020-12-10 MED ORDER — CETIRIZINE HCL 10 MG PO TABS: 10.0000 mg | ORAL_TABLET | Freq: Every day | ORAL | 3 refills | Status: DC

## 2020-12-10 MED ORDER — IBUPROFEN 600 MG PO TABS: 600.0000 mg | ORAL_TABLET | Freq: Three times a day (TID) | ORAL | 0 refills | Status: DC | PRN

## 2020-12-10 NOTE — Progress Notes (Signed)
Acute Office Visit  Subjective:    Patient ID: Joshua Gilmore, male    DOB: April 04, 2001, 20 y.o.   MRN: [id number]  Chief Complaint  Patient presents with  . Back Pain    Pt spine has been hurting for about 2 weeks has not tried anything over the counter did try exercise is worse when he works     HPI Patient is in today for evaluation of lower back pain, which is present for last 2 weeks. Pain is constant, worse with bending and radiates to right leg at times. Denies any numbness, tingling or weakness of LE. His pain is worse at the end of his shift. Denies any recent injury or heavy lifting. He works in Art therapist. He has been exercising to help with the back pain.  He also c/o scratchy throat and dry cough recently. Denies any dyspnea or wheezing. He has h/o allergic rhinitis.  Past Medical History:  Diagnosis Date  . ADHD (attention deficit hyperactivity disorder)   . Allergic rhinitis   . Autism spectrum disorder   . Generalized anxiety disorder 05/07/2013    History reviewed. No pertinent surgical history.  Family History  Problem Relation Age of Onset  . Hypertension Mother   . Learning disabilities Sister     Social History   Socioeconomic History  . Marital status: Single    Spouse name: Not on file  . Number of children: Not on file  . Years of education: Not on file  . Highest education level: Not on file  Occupational History  . 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 Smoking status: Never Smoker  . Smokeless tobacco: Never Used  Substance and Sexual Activity  . Alcohol use: Never  . Drug use: Never  . Sexual activity: Not on file  Other Topics Concern  . Not on file  Social History Narrative   Lives with mom,sister away at college   Social Determinants of Health   Financial Resource Strain: Low Risk   . Difficulty of Paying Living Expenses: Not hard at all  Food Insecurity: No Food Insecurity  . Worried  About Programme researcher, broadcasting/film/video in the Last Year: Never true  . Ran Out of Food in the Last Year: Never true  Transportation Needs: No Transportation Needs  . Lack of Transportation (Medical): No  . Lack of Transportation (Non-Medical): No  Physical Activity: Insufficiently Active  . Days of Exercise per Week: 3 days  . Minutes of Exercise per Session: 20 min  Stress: No Stress Concern Present  . Feeling of Stress : Not at all  Social Connections: Socially Isolated  . Frequency of Communication with Friends and Family: More than three times a week  . Frequency of Social Gatherings with Friends and Family: More than three times a week  . Attends Religious Services: Never  . Active Member of Clubs or Organizations: No  . Attends Banker Meetings: Never  . Marital Status: Never married  Intimate Partner Violence: Not At Risk  . Fear of Current or Ex-Partner: No  . Emotionally Abused: No  . Physically Abused: No  . Sexually Abused: No    Outpatient Medications Prior to Visit  Medication Sig Dispense Refill  . carbamide peroxide (DEBROX) 6.5 % OTIC solution Place 5 drops into the left ear 2 (two) times daily. 15 mL 0  . DERMA-SMOOTHE/FS SCALP 0.01 % OIL Apply topically at nighttime. 118 mL 1  . ibuprofen (  ADVIL) 600 MG tablet Take 1 tablet (600 mg total) by mouth every 8 (eight) hours as needed. 20 tablet 0   No facility-administered medications prior to visit.    No Known Allergies  Review of Systems  Constitutional: Negative for chills and fever.  HENT: Positive for sore throat. Negative for congestion, postnasal drip, sinus pressure and sinus pain.   Eyes: Negative for pain and discharge.  Respiratory: Negative for cough and shortness of breath.   Cardiovascular: Negative for chest pain and palpitations.  Gastrointestinal: Negative for constipation, diarrhea, nausea and vomiting.  Endocrine: Negative for polydipsia and polyuria.  Genitourinary: Negative for dysuria and  hematuria.  Musculoskeletal: Positive for back pain. Negative for neck pain and neck stiffness.  Skin: Negative for rash.  Neurological: Negative for dizziness, weakness, numbness and headaches.  Psychiatric/Behavioral: Negative for agitation and behavioral problems.       Objective:    Physical Exam Vitals reviewed.  Constitutional:      General: He is not in acute distress.    Appearance: He is not diaphoretic.  HENT:     Head: Normocephalic and atraumatic.     Nose: Nose normal.     Mouth/Throat:     Mouth: Mucous membranes are moist.  Eyes:     General: No scleral icterus.    Extraocular Movements: Extraocular movements intact.  Cardiovascular:     Rate and Rhythm: Normal rate and regular rhythm.     Pulses: Normal pulses.     Heart sounds: Normal heart sounds. No murmur heard.   Pulmonary:     Breath sounds: Normal breath sounds. No wheezing or rales.  Abdominal:     Palpations: Abdomen is soft.     Tenderness: There is no abdominal tenderness.  Musculoskeletal:     Cervical back: Neck supple. No tenderness.     Right lower leg: No edema.     Left lower leg: No edema.  Skin:    General: Skin is warm.     Findings: No rash.  Neurological:     General: No focal deficit present.     Mental Status: He is alert and oriented to person, place, and time.  Psychiatric:        Mood and Affect: Mood normal.        Behavior: Behavior normal.     BP 135/84 (BP Location: Right Arm, Patient Position: Sitting, Cuff Size: Normal)   Pulse 79   Temp 98.9 F (37.2 C) (Oral)   Resp 18   Ht 6' (1.829 m)   Wt 144 lb 1.9 oz (65.4 kg)   SpO2 99%   BMI 19.55 kg/m  Wt Readings from Last 3 Encounters:  12/10/20 144 lb 1.9 oz (65.4 kg) (32 %, Z= -0.46)*  10/22/20 145 lb 1.9 oz (65.8 kg) (34 %, Z= -0.40)*  09/01/20 151 lb 6.4 oz (68.7 kg) (46 %, Z= -0.11)*   * Growth percentiles are based on CDC (Boys, 2-20 Years) data.    Health Maintenance Due  Topic Date Due  .  Hepatitis C Screening  Never done    There are no preventive care reminders to display for this patient.   Lab Results  Component Value Date   TSH 1.100 05/02/2020   Lab Results  Component Value Date   WBC 4.0 05/02/2020   HGB 14.9 05/02/2020   HCT 47.7 05/02/2020   MCV 83 05/02/2020   PLT 353 05/02/2020   Lab Results  Component Value Date   NA  141 05/02/2020   K 4.5 05/02/2020   CO2 25 05/02/2020   GLUCOSE 93 05/02/2020   BUN 12 05/02/2020   CREATININE 0.98 05/02/2020   BILITOT 1.5 (H) 05/02/2020   ALKPHOS 74 05/02/2020   AST 22 05/02/2020   ALT 16 05/02/2020   PROT 7.7 05/02/2020   ALBUMIN 5.1 05/02/2020   CALCIUM 10.1 05/02/2020   Lab Results  Component Value Date   CHOL 160 05/02/2020   Lab Results  Component Value Date   HDL 62 05/02/2020   Lab Results  Component Value Date   LDLCALC 87 05/02/2020   Lab Results  Component Value Date   TRIG 52 05/02/2020   Lab Results  Component Value Date   CHOLHDL 2.6 05/02/2020   No results found for: HGBA1C     Assessment & Plan:   Problem List Items Addressed This Visit      Other   Seasonal allergies    Started Zyrtec Advised to use humidifier at nighttime to avoid dry air      Relevant Medications   cetirizine (ZYRTEC) 10 MG tablet    Other Visit Diagnoses    Acute low back pain without sciatica, unspecified back pain laterality    -  Primary Nontraumatic, reports h/o scoliosis Check X-ray lumbar spine Ibuprofen PRN Flexeril for muscle spasms Simple back exercises Avoid heavy lifting or frequent bending Avoid prolonged standing at work if possible    Relevant Medications   ibuprofen (ADVIL) 600 MG tablet   cyclobenzaprine (FLEXERIL) 5 MG tablet   Other Relevant Orders   DG Lumbar Spine Complete       Meds ordered this encounter  Medications  . ibuprofen (ADVIL) 600 MG tablet    Sig: Take 1 tablet (600 mg total) by mouth every 8 (eight) hours as needed.    Dispense:  20 tablet     Refill:  0  . cyclobenzaprine (FLEXERIL) 5 MG tablet    Sig: Take 1 tablet (5 mg total) by mouth 3 (three) times daily as needed for muscle spasms.    Dispense:  30 tablet    Refill:  1  . cetirizine (ZYRTEC) 10 MG tablet    Sig: Take 1 tablet (10 mg total) by mouth daily.    Dispense:  30 tablet    Refill:  3     Severiano Utsey Concha Se, MD

## 2020-12-10 NOTE — Assessment & Plan Note (Signed)
Started Zyrtec Advised to use humidifier at nighttime to avoid dry air

## 2020-12-10 NOTE — Patient Instructions (Signed)
Acute Back Pain, Adult Acute back pain is sudden and usually short-lived. It is often caused by an injury to the muscles and tissues in the back. The injury may result from:  A muscle or ligament getting overstretched or torn (strained). Ligaments are tissues that connect bones to each other. Lifting something improperly can cause a back strain.  Wear and tear (degeneration) of the spinal disks. Spinal disks are circular tissue that provide cushioning between the bones of the spine (vertebrae).  Twisting motions, such as while playing sports or doing yard work.  A hit to the back.  Arthritis. You may have a physical exam, lab tests, and imaging tests to find the cause of your pain. Acute back pain usually goes away with rest and home care. Follow these instructions at home: Managing pain, stiffness, and swelling  Treatment may include medicines for pain and inflammation that are taken by mouth or applied to the skin, prescription pain medicine, or muscle relaxants. Take over-the-counter and prescription medicines only as told by your health care provider.  Your health care provider may recommend applying ice during the first 24-48 hours after your pain starts. To do this: ? Put ice in a plastic bag. ? Place a towel between your skin and the bag. ? Leave the ice on for 20 minutes, 2-3 times a day.  If directed, apply heat to the affected area as often as told by your health care provider. Use the heat source that your health care provider recommends, such as a moist heat pack or a heating pad. ? Place a towel between your skin and the heat source. ? Leave the heat on for 20-30 minutes. ? Remove the heat if your skin turns bright red. This is especially important if you are unable to feel pain, heat, or cold. You have a greater risk of getting burned. Activity  Do not stay in bed. Staying in bed for more than 1-2 days can delay your recovery.  Sit up and stand up straight. Avoid leaning  forward when you sit or hunching over when you stand. ? If you work at a desk, sit close to it so you do not need to lean over. Keep your chin tucked in. Keep your neck drawn back, and keep your elbows bent at a 90-degree angle (right angle). ? Sit high and close to the steering wheel when you drive. Add lower back (lumbar) support to your car seat, if needed.  Take short walks on even surfaces as soon as you are able. Try to increase the length of time you walk each day.  Do not sit, drive, or stand in one place for more than 30 minutes at a time. Sitting or standing for long periods of time can put stress on your back.  Do not drive or use heavy machinery while taking prescription pain medicine.  Use proper lifting techniques. When you bend and lift, use positions that put less stress on your back: ? Bend your knees. ? Keep the load close to your body. ? Avoid twisting.  Exercise regularly as told by your health care provider. Exercising helps your back heal faster and helps prevent back injuries by keeping muscles strong and flexible.  Work with a physical therapist to make a safe exercise program, as recommended by your health care provider. Do any exercises as told by your physical therapist.   Lifestyle  Maintain a healthy weight. Extra weight puts stress on your back and makes it difficult to have   good posture.  Avoid activities or situations that make you feel anxious or stressed. Stress and anxiety increase muscle tension and can make back pain worse. Learn ways to manage anxiety and stress, such as through exercise. General instructions  Sleep on a firm mattress in a comfortable position. Try lying on your side with your knees slightly bent. If you lie on your back, put a pillow under your knees.  Follow your treatment plan as told by your health care provider. This may include: ? Cognitive or behavioral therapy. ? Acupuncture or massage therapy. ? Meditation or yoga. Contact  a health care provider if:  You have pain that is not relieved with rest or medicine.  You have increasing pain going down into your legs or buttocks.  Your pain does not improve after 2 weeks.  You have pain at night.  You lose weight without trying.  You have a fever or chills. Get help right away if:  You develop new bowel or bladder control problems.  You have unusual weakness or numbness in your arms or legs.  You develop nausea or vomiting.  You develop abdominal pain.  You feel faint. Summary  Acute back pain is sudden and usually short-lived.  Use proper lifting techniques. When you bend and lift, use positions that put less stress on your back.  Take over-the-counter and prescription medicines and apply heat or ice as directed by your health care provider. This information is not intended to replace advice given to you by your health care provider. Make sure you discuss any questions you have with your health care provider. Document Revised: 03/28/2020 Document Reviewed: 03/28/2020 Elsevier Patient Education  2021 [employer]  

## 2021-01-26 ENCOUNTER — Encounter: Payer: Self-pay | Admitting: Pediatrics

## 2021-03-01 ENCOUNTER — Encounter: Payer: Self-pay | Admitting: Emergency Medicine

## 2021-03-01 ENCOUNTER — Ambulatory Visit
Admission: EM | Admit: 2021-03-01 | Discharge: 2021-03-01 | Disposition: A | Discharge status: Home / Self Care | Payer: No Typology Code available for payment source | Attending: Family Medicine | Admitting: Family Medicine

## 2021-03-01 DIAGNOSIS — S00411A Abrasion of right ear, initial encounter: Secondary | ICD-10-CM

## 2021-03-01 DIAGNOSIS — H60541 Acute eczematoid otitis externa, right ear: Secondary | ICD-10-CM

## 2021-03-01 MED ORDER — PREDNISONE 10 MG PO TABS: 10.0000 mg | ORAL_TABLET | Freq: Every day | ORAL | 0 refills | Status: AC

## 2021-03-01 MED ORDER — HYDROCORTISONE 1 % EX OINT: 1.0000 "application " | TOPICAL_OINTMENT | Freq: Two times a day (BID) | CUTANEOUS | 0 refills | Status: DC | PRN

## 2021-03-01 NOTE — ED Notes (Signed)
Pt's mother ask to speak with provider again when attempting to d/c pt

## 2021-03-01 NOTE — Discharge Instructions (Addendum)
Take prednisone 10 mg daily for 5 days. Avoid scratching inside of the your ear. Once scratch inside of the ear heals and you experience any itching in the future, apply topical cortisone ointment to external canal of ear only. Taking cetrizine daily can reduce inner ear itching

## 2021-03-01 NOTE — ED Provider Notes (Signed)
[facility]    CSN: [id number] Arrival date & time: 03/01/21  1303      History   Chief Complaint Chief Complaint  Patient presents with   Ear Pain    HPI Joshua Gilmore is a 20 y.o. male.   HPI Patient presents today accompanied by his mother . Patient and mother report that he has recently been treated with a round of antibiotics and continues to have difficulty hearing and irritation involving the right ear.  He completed last dose of antibiotics the latter part of last week.  Mother attempted to clean his ears out with Debrox and patient is actively having bloody drainage from the right ear.  Endorses some muffled hearing but denies any tinnitus or fever. Past Medical History:  Diagnosis Date   ADHD (attention deficit hyperactivity disorder)    Allergic rhinitis    Autism spectrum disorder    Generalized anxiety disorder 05/07/2013    Patient Active Problem List   Diagnosis Date Noted   Seasonal allergies 12/10/2020   Alopecia 04/23/2020   Insomnia 04/23/2020   ADHD 04/23/2020   Generalized anxiety disorder 05/07/2013   Autism spectrum disorder     History reviewed. No pertinent surgical history.     Home Medications    Prior to Admission medications   Medication Sig Start Date End Date Taking? Authorizing Provider  carbamide peroxide (DEBROX) 6.5 % OTIC solution Place 5 drops into the left ear 2 (two) times daily. 09/01/20   Anabel Halon, MD  cetirizine (ZYRTEC) 10 MG tablet Take 1 tablet (10 mg total) by mouth daily. 12/10/20   Anabel Halon, MD  cyclobenzaprine (FLEXERIL) 5 MG tablet Take 1 tablet (5 mg total) by mouth 3 (three) times daily as needed for muscle spasms. 12/10/20   Anabel Halon, MD  DERMA-SMOOTHE/FS SCALP 0.01 % OIL Apply topically at nighttime. 04/23/20   Anabel Halon, MD  ibuprofen (ADVIL) 600 MG tablet Take 1 tablet (600 mg total) by mouth every 8 (eight) hours as needed. 12/10/20   Anabel Halon, MD    Family  History Family History  Problem Relation Age of Onset   Hypertension Mother    Learning disabilities Sister     Social History Social History   Tobacco Use   Smoking status: Never   Smokeless tobacco: Never  Substance Use Topics   Alcohol use: Never   Drug use: Never     Allergies   Patient has no known allergies.   Review of Systems Review of Systems Pertinent negatives listed in HPI   Physical Exam Triage Vital Signs ED Triage Vitals [03/01/21 1506]  Enc Vitals Group     BP 132/82     Pulse Rate (!) 58     Resp 18     Temp 98.8 F (37.1 C)     Temp Source Oral     SpO2 97 %     Weight      Height      Head Circumference      Peak Flow      Pain Score 7     Pain Loc      Pain Edu?      Excl. in GC?    No data found.  Updated Vital Signs BP 132/82 (BP Location: Right Arm)   Pulse (!) 58   Temp 98.8 F (37.1 C) (Oral)   Resp 18   SpO2 97%   Visual Acuity Right Eye Distance:  Left Eye Distance:   Bilateral Distance:    Right Eye Near:   Left Eye Near:    Bilateral Near:     Physical Exam General Appearance:    Alert, cooperative, no distress  HENT:   Right ear canal excoriated with serosanguinous drainage present, TM w/o budge or redness. Left ear normal. Nares patent  Eyes:    PERRL, conjunctiva/corneas clear, EOM's intact       Lungs:     Clear to auscultation bilaterally, respirations unlabored  Heart:    Regular rate and rhythm  Neurologic:   Awake, alert, oriented x 3. No apparent focal neurological           defect.         UC Treatments / Results  Labs (all labs ordered are listed, but only abnormal results are displayed) Labs Reviewed - No data to display  EKG   Radiology No results found.  Procedures Procedures (including critical care time)  Medications Ordered in UC Medications - No data to display  Initial Impression / Assessment and Plan / UC Course  I have reviewed the triage vital signs and the nursing  notes.  Pertinent labs & imaging results that were available during my care of the patient were reviewed by me and considered in my medical decision making (see chart for details).    Prednisone 10 mg daily x 5 days. Hydrocortisone ointment PRN  for itching of ear canal (advised not to apply while excoriation present) encouraged daily cetrizine to reduce skin itching. Follow-up with PCP as needed. Final Clinical Impressions(s) / UC Diagnoses   Final diagnoses:  Dermatitis of ear canal, right  Excoriation of ear canal, right, initial encounter     Discharge Instructions      Take prednisone 10 mg daily for 5 days. Avoid scratching inside of the your ear. Once scratch inside of the ear heals and you experience any itching in the future, apply topical cortisone ointment to external canal of ear only. Taking cetrizine daily can reduce inner ear itching    ED Prescriptions     Medication Sig Dispense Auth. Provider   predniSONE (DELTASONE) 10 MG tablet Take 1 tablet (10 mg total) by mouth daily with breakfast for 5 days. 5 tablet Bing Neighbors, FNP   hydrocortisone 1 % ointment Apply 1 application topically 2 (two) times daily as needed for itching. 30 g Bing Neighbors, FNP      PDMP not reviewed this encounter.   Bing Neighbors, FNP 03/01/21 1534

## 2021-03-01 NOTE — ED Triage Notes (Signed)
RT ear pain for over a month.  Has had one abx shot and 1 course of oral abx.

## 2021-03-19 ENCOUNTER — Ambulatory Visit (INDEPENDENT_AMBULATORY_CARE_PROVIDER_SITE_OTHER): Payer: Self-pay | Admitting: Internal Medicine

## 2021-03-19 ENCOUNTER — Telehealth: Payer: Self-pay

## 2021-03-19 ENCOUNTER — Encounter: Payer: Self-pay | Admitting: Internal Medicine

## 2021-03-19 ENCOUNTER — Other Ambulatory Visit: Payer: Self-pay

## 2021-03-19 VITALS — BP 125/81 | HR 73 | Temp 98.8°F | Resp 18 | Ht 72.0 in | Wt 141.1 lb

## 2021-03-19 DIAGNOSIS — H60502 Unspecified acute noninfective otitis externa, left ear: Secondary | ICD-10-CM

## 2021-03-19 DIAGNOSIS — L309 Dermatitis, unspecified: Secondary | ICD-10-CM

## 2021-03-19 DIAGNOSIS — F840 Autistic disorder: Secondary | ICD-10-CM

## 2021-03-19 DIAGNOSIS — Z23 Encounter for immunization: Secondary | ICD-10-CM

## 2021-03-19 MED ORDER — TRIAMCINOLONE ACETONIDE 0.025 % EX CREA: 1.0000 "application " | TOPICAL_CREAM | Freq: Two times a day (BID) | CUTANEOUS | 0 refills | Status: DC

## 2021-03-19 NOTE — Telephone Encounter (Signed)
Lmom I have scheduled appt for [facility] phone visit 04/02/21 12:00pm - also mailed notice of phone visit to home address / [place]

## 2021-03-19 NOTE — Progress Notes (Signed)
o  Acute Office Visit  Subjective:    Patient ID: Joshua Gilmore, male    DOB: [birth year], 20 y.o.   MRN: [id number]  Chief Complaint  Patient presents with   Ear Pain    Pt having left ear pain this was in his right ear and moved to left right ear is better but had to go to urgent care 3 times with the right ear also has a rash under his nose that itches and burns    HPI Patient is in today for evaluation of left ear pain, that has been present for last few weeks. He has had Urgent care visit and was prescribed for ear drops for it. He does not recall the name, but his mother states that it starts with 'O'. His ear pain has resolved now. Denies any ear discharge.  He also c/o a rash around his right nostril, which has been present for a long time. He also has dryness over left earlobe and states that he feels that it is a rash. He c/o itching around the rash area.  Past Medical History:  Diagnosis Date   ADHD (attention deficit hyperactivity disorder)    Allergic rhinitis    Autism spectrum disorder    Generalized anxiety disorder 05/07/2013    History reviewed. No pertinent surgical history.  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  Tobacco Use   Smoking status: Never   Smokeless tobacco: Never  Substance and Sexual Activity   Alcohol use: Never   Drug use: Never   Sexual activity: Not on file  Other Topics Concern   Not on file  Social History Narrative   Lives with mom,sister away at college   Social Determinants of Health   Financial Resource Strain: Low Risk    Difficulty of Paying Living Expenses: Not hard at all  Food  Insecurity: No Food Insecurity   Worried About Programme researcher, broadcasting/film/video in the Last Year: Never true   Ran Out of Food in the Last Year: Never true  Transportation Needs: No Transportation Needs   Lack of Transportation (Medical): No   Lack of Transportation (Non-Medical): No  Physical Activity: Insufficiently Active   Days of Exercise per Week: 3 days   Minutes of Exercise per Session: 20 min  Stress: No Stress Concern Present   Feeling of Stress : Not at all  Social Connections: Socially Isolated   Frequency of Communication with Friends and Family: More than three times a week   Frequency of Social Gatherings with Friends and Family: More than three times a week   Attends Religious Services: Never   Database administrator or Organizations: No   Attends Engineer, structural: Never   Marital Status: Never married  Catering manager Violence: Not At Risk   Fear of Current or Ex-Partner: No   Emotionally Abused: No   Physically Abused: No   Sexually Abused: No    Outpatient Medications Prior to Visit  Medication Sig Dispense Refill   carbamide peroxide (DEBROX) 6.5 % OTIC solution Place 5 drops into the  left ear 2 (two) times daily. 15 mL 0   cetirizine (ZYRTEC) 10 MG tablet Take 1 tablet (10 mg total) by mouth daily. 30 tablet 3   DERMA-SMOOTHE/FS SCALP 0.01 % OIL Apply topically at nighttime. 118 mL 1   hydrocortisone 1 % ointment Apply 1 application topically 2 (two) times daily as needed for itching. 30 g 0   ibuprofen (ADVIL) 600 MG tablet Take 1 tablet (600 mg total) by mouth every 8 (eight) hours as needed. 20 tablet 0   cyclobenzaprine (FLEXERIL) 5 MG tablet Take 1 tablet (5 mg total) by mouth 3 (three) times daily as needed for muscle spasms. (Patient not taking: Reported on 03/19/2021) 30 tablet 1   No facility-administered medications prior to visit.    No Known Allergies  Review of Systems  Constitutional:  Negative for chills and fever.  HENT:  Positive for ear  pain. Negative for congestion, ear discharge, sinus pressure and sinus pain.   Respiratory:  Negative for cough and shortness of breath.   Skin:  Positive for rash.  Psychiatric/Behavioral:  Negative for dysphoric mood. The patient is not nervous/anxious.       Objective:    Physical Exam Vitals reviewed.  Constitutional:      Joshua: He is not in acute distress.    Appearance: He is not diaphoretic.  HENT:     Head: Normocephalic and atraumatic.     Right Ear: Tympanic membrane, ear canal and external ear normal. There is no impacted cerumen.     Ears:     Comments: Left ear canal mildly erythematous No tenderness over tragal area    Nose: Nose normal.     Mouth/Throat:     Mouth: Mucous membranes are moist.  Eyes:     Joshua: No scleral icterus.    Extraocular Movements: Extraocular movements intact.     Pupils: Pupils are equal, round, and reactive to light.  Cardiovascular:     Rate and Rhythm: Normal rate and regular rhythm.     Heart sounds: Normal heart sounds. No murmur heard. Abdominal:     Palpations: Abdomen is soft.  Musculoskeletal:     Cervical back: Neck supple. No tenderness.  Skin:    Joshua: Skin is warm.     Findings: Rash (Thickened skin near right nostril and left ear lobe, no erythema) present.  Neurological:     Joshua: No focal deficit present.     Mental Status: He is alert and oriented to person, place, and time.  Psychiatric:        Mood and Affect: Mood normal.        Behavior: Behavior normal.    BP 125/81 (BP Location: Left Arm, Patient Position: Sitting, Cuff Size: Normal)   Pulse 73   Temp 98.8 F (37.1 C) (Oral)   Resp 18   Ht 6' (1.829 m)   Wt 141 lb 1.3 oz (64 kg)   SpO2 97%   BMI 19.13 kg/m  Wt Readings from Last 3 Encounters:  03/19/21 141 lb 1.3 oz (64 kg) (26 %, Z= -0.63)*  12/10/20 144 lb 1.9 oz (65.4 kg) (32 %, Z= -0.46)*  10/22/20 145 lb 1.9 oz (65.8 kg) (34 %, Z= -0.40)*   * Growth percentiles are based on CDC  (Boys, 2-20 Years) data.    Health Maintenance Due  Topic Date Due   HIV Screening  Never done   Hepatitis C Screening  Never done    There are no preventive care  reminders to display for this patient.   Lab Results  Component Value Date   TSH 1.100 05/02/2020   Lab Results  Component Value Date   WBC 4.0 05/02/2020   HGB 14.9 05/02/2020   HCT 47.7 05/02/2020   MCV 83 05/02/2020   PLT 353 05/02/2020   Lab Results  Component Value Date   NA 141 05/02/2020   K 4.5 05/02/2020   CO2 25 05/02/2020   GLUCOSE 93 05/02/2020   BUN 12 05/02/2020   CREATININE 0.98 05/02/2020   BILITOT 1.5 (H) 05/02/2020   ALKPHOS 74 05/02/2020   AST 22 05/02/2020   ALT 16 05/02/2020   PROT 7.7 05/02/2020   ALBUMIN 5.1 05/02/2020   CALCIUM 10.1 05/02/2020   Lab Results  Component Value Date   CHOL 160 05/02/2020   Lab Results  Component Value Date   HDL 62 05/02/2020   Lab Results  Component Value Date   LDLCALC 87 05/02/2020   Lab Results  Component Value Date   TRIG 52 05/02/2020   Lab Results  Component Value Date   CHOLHDL 2.6 05/02/2020   No results found for: HGBA1C     Assessment & Plan:   Problem List Items Addressed This Visit       Other   Autism spectrum disorder   Relevant Orders   Ambulatory referral to Psychiatry   Other Visit Diagnoses     Facial eczema    -  Primary Rash appears to be allergic in nature or due to chronic irritation Advised to apply lotion and/or coconut oil Kenalog cream Advised to make sure to avoid any mucosal exposure    Relevant Medications   triamcinolone (KENALOG) 0.025 % cream   Acute otitis externa of left ear, unspecified type     Now better, likely with Ofloxacin otic drops If recurrent, will refer to ENT specialist Advised to avoid using sharp objects for cleaning purposes    Need for immunization against influenza       Relevant Orders   Flu Vaccine QUAD 31mo+IM (Fluarix, Fluzone & Alfiuria Quad PF) (Completed)         Meds ordered this encounter  Medications   triamcinolone (KENALOG) 0.025 % cream    Sig: Apply 1 application topically 2 (two) times daily.    Dispense:  30 g    Refill:  0     Leala Bryand Concha Se, MD

## 2021-03-19 NOTE — Patient Instructions (Signed)
Please use Triamcinolone cream over rash area. Please make sure it does not reach any mucosal area, like nasal cavity, mouth or near eyes.  Continue to use ear drops as ear infection appears to have improved.

## 2021-04-02 ENCOUNTER — Ambulatory Visit (INDEPENDENT_AMBULATORY_CARE_PROVIDER_SITE_OTHER): Payer: Self-pay | Admitting: Licensed Clinical Social Worker

## 2021-04-02 ENCOUNTER — Other Ambulatory Visit: Payer: Self-pay

## 2021-04-02 DIAGNOSIS — F411 Generalized anxiety disorder: Secondary | ICD-10-CM

## 2021-04-02 DIAGNOSIS — F840 Autistic disorder: Secondary | ICD-10-CM

## 2021-04-02 DIAGNOSIS — F902 Attention-deficit hyperactivity disorder, combined type: Secondary | ICD-10-CM

## 2021-04-17 NOTE — BH Specialist Note (Signed)
[place] [facility] Initial TeleMedicine Clinical Assessment  MRN: [id number] NAME: Joshua Gilmore Date: 04/17/21  Start time: 12 End time: 1215 Total time: 15  Types of Service: Telephone visit Referring Provider: Dr. Allena Katz Reason for Visit today: begin BH servie  Patient/Family location: home Behavioral Health Provider location: home office All persons participating in visit: [name] & Clinician  I connected with patient and/or family via Telephone or Video Enabled Telemedicine Application  (Video is Caregility application) and verified that I am speaking with the correct person using two identifiers.   Discussed confidentiality: Yes   I discussed the limitations of telemedicine and the availability of in person appointments.  Discussed there is a possibility of technology failure and discussed alternative modes of communication if that failure occurs.  I discussed that engaging in this telemedicine visit, they consent to the provision of behavioral healthcare and the services will be billed under their insurance.  Patient and/or legal guardian expressed understanding and consented to Telemedicine visit: Yes   Treatment History Patient recently received Inpatient Treatment: No  Patient currently being seen by therapist/psychiatrist: No  Patient currently receiving the following services: no  Past Psychiatric History/Diagnosis/Hospitalization(s): Anxiety: No Bipolar Disorder: No Depression: No Mania: No Psychosis: No Schizophrenia: No Personality Disorder: No Hospitalization for psychiatric illness: No History of Electroconvulsive Shock Therapy: No Prior Suicide Attempts: No  Decreased need for sleep: No  Euphoria: No  Self Injurious behaviors: No  Family History of mental illness: No  Family History of substance abuse: No  Substance Abuse: No  DUI: No  Insomnia: No   History of violence: No  Physical, sexual or emotional abuse: No  Prior outpatient mental  health therapy: No   Clinical Assessment  PHQ-9 & GAD-7 Assessments: No reading; Patient request in person visit  Social Functioning Social maturity: below Social judgement: below  Stress Current stressors: "I don't really know" Familial stressors: independence Sleep: fair Appetite: "it is ok" Coping ability: "I am ok" Patient taking medications as prescribed:    Current medications:  Outpatient Encounter Medications as of 04/02/2021  Medication Sig   carbamide peroxide (DEBROX) 6.5 % OTIC solution Place 5 drops into the left ear 2 (two) times daily.   cetirizine (ZYRTEC) 10 MG tablet Take 1 tablet (10 mg total) by mouth daily.   DERMA-SMOOTHE/FS SCALP 0.01 % OIL Apply topically at nighttime.   hydrocortisone 1 % ointment Apply 1 application topically 2 (two) times daily as needed for itching.   ibuprofen (ADVIL) 600 MG tablet Take 1 tablet (600 mg total) by mouth every 8 (eight) hours as needed.   triamcinolone (KENALOG) 0.025 % cream Apply 1 application topically 2 (two) times daily.   No facility-administered encounter medications on file as of 04/02/2021.     Self-harm and/or Suicidal Behaviors Risk Assessment Self-harm risk factors: no Patient endorses recent self injurious thoughts and/or behaviors: No  Suicide ideations: No plan to harm self or others   Danger to Others Risk Assessment Danger to others risk factors: no Patient endorses recent thoughts of harming others: No    Substance Use Assessment Patient recently consumed alcohol: No  Patient recently used drugs: No  Patient is concerned about dependence or abuse of substances: No    Goals, Interventions and Follow-up Plan Goals: Increase healthy adjustment to current life circumstances Interventions: Mindfulness or Relaxation Training Follow-up Plan: Refer to [facility] Outpatient Therapy at [facility]  Summary of Clinical Assessment Summary: Joshua Gilmore is a 20 yr old male referred for [facility] by  Dr. Allena Katz. Joshua Gilmore  reports that he wants traditional counseling as he feels that he will do better with in person visits due to his autism.    Joshua Elk, LCSW

## 2021-05-01 ENCOUNTER — Encounter: Payer: No Typology Code available for payment source | Admitting: Internal Medicine

## 2021-07-29 ENCOUNTER — Ambulatory Visit (INDEPENDENT_AMBULATORY_CARE_PROVIDER_SITE_OTHER): Payer: Managed Care, Other (non HMO) | Admitting: Internal Medicine

## 2021-07-29 ENCOUNTER — Encounter: Payer: Self-pay | Admitting: Internal Medicine

## 2021-07-29 ENCOUNTER — Other Ambulatory Visit: Payer: Self-pay

## 2021-07-29 ENCOUNTER — Other Ambulatory Visit: Payer: Self-pay | Admitting: *Deleted

## 2021-07-29 VITALS — BP 108/72 | HR 52 | Resp 18 | Ht 66.0 in | Wt 144.1 lb

## 2021-07-29 DIAGNOSIS — Z0001 Encounter for general adult medical examination with abnormal findings: Secondary | ICD-10-CM | POA: Insufficient documentation

## 2021-07-29 DIAGNOSIS — Z1159 Encounter for screening for other viral diseases: Secondary | ICD-10-CM | POA: Diagnosis not present

## 2021-07-29 DIAGNOSIS — F902 Attention-deficit hyperactivity disorder, combined type: Secondary | ICD-10-CM

## 2021-07-29 DIAGNOSIS — Z114 Encounter for screening for human immunodeficiency virus [HIV]: Secondary | ICD-10-CM

## 2021-07-29 DIAGNOSIS — F840 Autistic disorder: Secondary | ICD-10-CM

## 2021-07-29 DIAGNOSIS — F411 Generalized anxiety disorder: Secondary | ICD-10-CM

## 2021-07-29 DIAGNOSIS — H66002 Acute suppurative otitis media without spontaneous rupture of ear drum, left ear: Secondary | ICD-10-CM

## 2021-07-29 MED ORDER — OFLOXACIN 0.3 % OT SOLN: 5.0000 [drp] | Freq: Every day | OTIC | 0 refills | Status: DC

## 2021-07-29 NOTE — Progress Notes (Signed)
Established Patient Office Visit  Subjective:  Patient ID: Joshua Gilmore, male    DOB: [birth year]  Age: 21 y.o. MRN: [id number]  CC:  Chief Complaint  Patient presents with   Annual Exam    Annual exam pt is having pain in right hand when he writes on pencil and paper thinks it may be arthritis     HPI Joshua Gilmore is a 21 y.o. male with past medical history of autism spectrum disorder and alopecia who presents for annual physical.  He has been evaluated by [facility] therapist virtually, but he prefers in office visit.  He has a history of autism spectrum disorder.  He currently works in Libr[employer], academic.  He complains of bilateral hand pain of small joints, especially while writing.  Denies any redness, warmth or swelling of the joints.  His mother reports that he has been playing video games for long times, which can also provoke small joint pain.  He also complains of left ear pain and discomfort.  Denies any hearing loss, tinnitus or balance problem.  Denies any ear discharge.  Denies any fever, chills, nausea, vomiting, nasal congestion or sore throat.  Past Medical History:  Diagnosis Date   ADHD (attention deficit hyperactivity disorder)    Allergic rhinitis    Autism spectrum disorder    Generalized anxiety disorder 05/07/2013    History reviewed. No pertinent surgical history.  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  Tobacco Use   Smoking status: Never   Smokeless tobacco: Never  Substance and Sexual Activity   Alcohol use: Never   Drug use: Never   Sexual activity: Not on file  Other Topics Concern   Not on file   Social History Narrative   Lives with mom,sister away at college   Social Determinants of Health   Financial Resource Strain: Not on file  Food Insecurity: Not on file  Transportation Needs: Not on file  Physical Activity: Not on file  Stress: Not on file  Social Connections: Not on file  Intimate Partner Violence: Not on file    Outpatient Medications Prior to Visit  Medication Sig Dispense Refill   carbamide peroxide (DEBROX) 6.5 % OTIC solution Place 5 drops into the left ear 2 (two) times daily. 15 mL 0   cetirizine (ZYRTEC) 10 MG tablet Take 1 tablet (10 mg total) by mouth daily. 30 tablet 3   DERMA-SMOOTHE/FS SCALP 0.01 % OIL Apply topically at nighttime. 118 mL 1   hydrocortisone 1 % ointment Apply 1 application topically 2 (two) times daily as needed for itching. 30 g 0   ibuprofen (ADVIL) 600 MG tablet Take 1 tablet (600 mg total) by mouth every 8 (eight) hours as needed. 20 tablet 0   triamcinolone (KENALOG) 0.025 % cream Apply 1 application topically 2 (two) times daily. 30 g 0   No facility-administered medications prior to visit.    No Known Allergies  ROS Review of Systems  Constitutional:  Negative for chills and fever.  HENT:  Positive for ear pain. Negative for congestion, ear discharge, sinus pressure and  sinus pain.   Respiratory:  Negative for cough and shortness of breath.   Cardiovascular:  Negative for chest pain and palpitations.  Gastrointestinal:  Negative for constipation, diarrhea and vomiting.  Genitourinary:  Negative for dysuria and hematuria.  Musculoskeletal:  Negative for neck pain and neck stiffness.  Skin:  Negative for rash.  Neurological:  Negative for dizziness and weakness.  Psychiatric/Behavioral:  Negative for dysphoric mood. The patient is not nervous/anxious.      Objective:    Physical Exam Vitals reviewed.  Constitutional:      General: He is not in acute distress.    Appearance: He is not diaphoretic.  HENT:     Head:  Normocephalic and atraumatic.     Right Ear: Tympanic membrane, ear canal and external ear normal. There is no impacted cerumen.     Left Ear: A middle ear effusion is present.     Ears:     Comments: Left ear canal mildly erythematous No tenderness over tragal area    Nose: Nose normal.     Mouth/Throat:     Mouth: Mucous membranes are moist.  Eyes:     General: No scleral icterus.    Extraocular Movements: Extraocular movements intact.     Pupils: Pupils are equal, round, and reactive to light.  Cardiovascular:     Rate and Rhythm: Normal rate and regular rhythm.     Heart sounds: Normal heart sounds. No murmur heard. Abdominal:     Palpations: Abdomen is soft.  Musculoskeletal:     Cervical back: Neck supple. No tenderness.  Skin:    General: Skin is warm.     Findings: Rash (Thickened skin near right nostril and left ear lobe, no erythema) present.  Neurological:     General: No focal deficit present.     Mental Status: He is alert and oriented to person, place, and time.  Psychiatric:        Mood and Affect: Mood normal.        Behavior: Behavior normal.    BP 108/72 (BP Location: Left Arm, Patient Position: Sitting, Cuff Size: Normal)    Pulse (!) 52    Resp 18    Ht 5' 6"  (1.676 m)    Wt 144 lb 1.9 oz (65.4 kg)    SpO2 98%    BMI 23.26 kg/m  Wt Readings from Last 3 Encounters:  07/29/21 144 lb 1.9 oz (65.4 kg)  03/19/21 141 lb 1.3 oz (64 kg) (26 %, Z= -0.63)*  12/10/20 144 lb 1.9 oz (65.4 kg) (32 %, Z= -0.46)*   * Growth percentiles are based on CDC (Boys, 2-20 Years) data.    Lab Results  Component Value Date   TSH 1.100 05/02/2020   Lab Results  Component Value Date   WBC 4.0 05/02/2020   HGB 14.9 05/02/2020   HCT 47.7 05/02/2020   MCV 83 05/02/2020   PLT 353 05/02/2020   Lab Results  Component Value Date   NA 141 05/02/2020   K 4.5 05/02/2020   CO2 25 05/02/2020   GLUCOSE 93 05/02/2020   BUN 12 05/02/2020   CREATININE 0.98 05/02/2020   BILITOT  1.5 (H) 05/02/2020   ALKPHOS 74 05/02/2020   AST 22 05/02/2020   ALT 16 05/02/2020   PROT 7.7 05/02/2020   ALBUMIN 5.1 05/02/2020   CALCIUM 10.1 05/02/2020   Lab Results  Component Value Date   CHOL 160 05/02/2020   Lab Results  Component Value Date  HDL 62 05/02/2020   Lab Results  Component Value Date   LDLCALC 87 05/02/2020   Lab Results  Component Value Date   TRIG 52 05/02/2020   Lab Results  Component Value Date   CHOLHDL 2.6 05/02/2020   No results found for: HGBA1C    Assessment & Plan:   Problem List Items Addressed This Visit       Encounter for general adult medical examination with abnormal findings - Primary   Physical exam as documented. Fasting blood tests today. Has had flu vaccine.     Relevant Orders  VITAMIN D 25 Hydroxy (Vit-D Deficiency, Fractures)  TSH  CMP14+EGFR  CBC with Differential/Platelet    Other   Autism spectrum disorder    Referred to Psychiatry - [facility] in [place]      Relevant Orders   Ambulatory referral to Psychiatry   Other Visit Diagnoses     Encounter for screening for HIV       Relevant Orders   HIV antibody (with reflex)   Need for hepatitis C screening test       Relevant Orders   Hepatitis C Antibody   Non-recurrent acute suppurative otitis media of left ear without spontaneous rupture of tympanic membrane       Relevant Medications   ofloxacin (FLOXIN OTIC) 0.3 % OTIC solution       Meds ordered this encounter  Medications   ofloxacin (FLOXIN OTIC) 0.3 % OTIC solution    Sig: Place 5 drops into the left ear daily.    Dispense:  5 mL    Refill:  0    Follow-up: Return in about 6 months (around 01/26/2022).    Lindell Spar, MD

## 2021-07-29 NOTE — Patient Instructions (Signed)
Please use Ofloxacin ear drop in the left ear.  Please take Tylenol and/or use Diclofenac gel for hand pain.  Please continue to take at least 64 ounces of fluid in a day and eat at regular intervals.

## 2021-07-29 NOTE — Assessment & Plan Note (Signed)
Referred to Psychiatry - [facility] in [place]

## 2021-07-29 NOTE — Assessment & Plan Note (Signed)
Physical exam as documented. Fasting blood tests today. Has had flu vaccine.

## 2021-07-30 LAB — CBC WITH DIFFERENTIAL/PLATELET
Basophils Absolute: 0 10*3/uL (ref 0.0–0.2)
Basos: 1 %
EOS (ABSOLUTE): 0.1 10*3/uL (ref 0.0–0.4)
Eos: 2 %
Hematocrit: 47 % (ref 37.5–51.0)
Hemoglobin: 15.2 g/dL (ref 13.0–17.7)
Immature Grans (Abs): 0 10*3/uL (ref 0.0–0.1)
Immature Granulocytes: 0 %
Lymphocytes Absolute: 1.3 10*3/uL (ref 0.7–3.1)
Lymphs: 39 %
MCH: 26.4 pg — ABNORMAL LOW (ref 26.6–33.0)
MCHC: 32.3 g/dL (ref 31.5–35.7)
MCV: 82 fL (ref 79–97)
Monocytes Absolute: 0.3 10*3/uL (ref 0.1–0.9)
Monocytes: 8 %
Neutrophils Absolute: 1.7 10*3/uL (ref 1.4–7.0)
Neutrophils: 50 %
Platelets: 312 10*3/uL (ref 150–450)
RBC: 5.75 x10E6/uL (ref 4.14–5.80)
RDW: 13.5 % (ref 11.6–15.4)
WBC: 3.4 10*3/uL (ref 3.4–10.8)

## 2021-07-30 LAB — CMP14+EGFR
ALT: 14 IU/L (ref 0–44)
AST: 20 IU/L (ref 0–40)
Albumin/Globulin Ratio: 2.3 — ABNORMAL HIGH (ref 1.2–2.2)
Albumin: 5.4 g/dL — ABNORMAL HIGH (ref 4.1–5.2)
Alkaline Phosphatase: 72 IU/L (ref 51–125)
BUN/Creatinine Ratio: 11 (ref 9–20)
BUN: 12 mg/dL (ref 6–20)
Bilirubin Total: 2.1 mg/dL — ABNORMAL HIGH (ref 0.0–1.2)
CO2: 25 mmol/L (ref 20–29)
Calcium: 10.2 mg/dL (ref 8.7–10.2)
Chloride: 99 mmol/L (ref 96–106)
Creatinine, Ser: 1.05 mg/dL (ref 0.76–1.27)
Globulin, Total: 2.4 g/dL (ref 1.5–4.5)
Glucose: 80 mg/dL (ref 70–99)
Potassium: 4.3 mmol/L (ref 3.5–5.2)
Sodium: 140 mmol/L (ref 134–144)
Total Protein: 7.8 g/dL (ref 6.0–8.5)
eGFR: 104 mL/min/{1.73_m2} (ref >59)

## 2021-07-30 LAB — VITAMIN D 25 HYDROXY (VIT D DEFICIENCY, FRACTURES): Vit D, 25-Hydroxy: 36.3 ng/mL (ref 30.0–100.0)

## 2021-07-30 LAB — HEPATITIS C ANTIBODY: Hep C Virus Ab: <0.1 s/co ratio (ref 0.0–0.9)

## 2021-07-30 LAB — HIV ANTIBODY (ROUTINE TESTING W REFLEX): HIV Screen 4th Generation wRfx: NONREACTIVE

## 2021-07-30 LAB — TSH: TSH: 0.865 u[IU]/mL (ref 0.450–4.500)

## 2021-08-10 ENCOUNTER — Encounter: Payer: Self-pay | Admitting: Internal Medicine

## 2021-08-10 NOTE — Telephone Encounter (Signed)
LMTRC

## 2021-08-12 ENCOUNTER — Telehealth: Payer: Self-pay

## 2021-08-12 NOTE — Telephone Encounter (Signed)
LVM for pt to call the office.

## 2021-08-12 NOTE — Telephone Encounter (Signed)
Patient called lvm returning call about lab results.

## 2021-08-17 ENCOUNTER — Other Ambulatory Visit: Payer: Self-pay

## 2021-08-17 ENCOUNTER — Ambulatory Visit (HOSPITAL_COMMUNITY)
Admission: RE | Admit: 2021-08-17 | Discharge: 2021-08-17 | Disposition: A | Discharge status: Home / Self Care | Payer: Managed Care, Other (non HMO) | Source: Ambulatory Visit | Attending: Internal Medicine | Admitting: Internal Medicine

## 2021-08-17 DIAGNOSIS — M545 Low back pain, unspecified: Secondary | ICD-10-CM | POA: Diagnosis present

## 2021-08-18 NOTE — Telephone Encounter (Signed)
FYI Patient has scheduled an appointment mychart to come into the office to go over lab and image results.

## 2021-08-18 NOTE — Telephone Encounter (Signed)
Does not need an appt for this please cancel this and let him know we have tried to call multiple times to please leave [place] a number we can reach the pt at

## 2021-08-21 ENCOUNTER — Encounter: Payer: Self-pay | Admitting: *Deleted

## 2021-08-26 ENCOUNTER — Ambulatory Visit: Payer: Managed Care, Other (non HMO) | Admitting: Internal Medicine

## 2021-08-26 ENCOUNTER — Other Ambulatory Visit: Payer: Self-pay

## 2021-08-26 ENCOUNTER — Encounter: Payer: Self-pay | Admitting: Internal Medicine

## 2021-08-26 VITALS — BP 122/84 | HR 67 | Resp 18 | Ht 69.0 in | Wt 147.0 lb

## 2021-08-26 DIAGNOSIS — H6522 Chronic serous otitis media, left ear: Secondary | ICD-10-CM

## 2021-08-26 NOTE — Patient Instructions (Signed)
Please take Vitamin D 1000 IU once daily.  You are being referred to ENT specialist.

## 2021-08-26 NOTE — Assessment & Plan Note (Signed)
Referred to ENT for persistent symptoms Has been treated with otic Ofloxacin drops for otitis externa in the past, ear exam overall benign today

## 2021-08-26 NOTE — Progress Notes (Signed)
Acute Office Visit  Subjective:    Patient ID: Joshua Gilmore, male    DOB: [birth year], 21 y.o.   MRN: [id number]  Chief Complaint  Patient presents with   Acute Visit    Pt left ear tickles doesn't hurt would like ear referral ent in [place] would like to go over lab and xray results called pt multiple times with no answer    HPI Patient is in today for complaint of persistent ear discomfort.  He has tried using Debrox for earwax.  He currently denies any ear discharge or pain, but feels ear popping at times.  Denies any dizziness or tinnitus currently.  He has been treated with otic antibiotic drops for otitis externa in the past.  Blood test and x-ray of the lumbar spine results were reviewed with the patient and his mother.  Past Medical History:  Diagnosis Date   ADHD (attention deficit hyperactivity disorder)    Allergic rhinitis    Autism spectrum disorder    Generalized anxiety disorder 05/07/2013    History reviewed. No pertinent surgical history.  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  Tobacco Use   Smoking status: Never   Smokeless tobacco: Never  Substance and Sexual Activity   Alcohol use: Never   Drug use: Never   Sexual activity: Not on file  Other Topics Concern   Not on file  Social History Narrative   Lives with mom,sister away at college   Social Determinants of Health   Financial Resource Strain: Not on file  Food Insecurity: Not on file  Transportation Needs: Not on file  Physical Activity: Not on file  Stress: Not on file  Social Connections: Not on file  Intimate Partner Violence: Not on file     Outpatient Medications Prior to Visit  Medication Sig Dispense Refill   carbamide peroxide (DEBROX) 6.5 % OTIC solution Place 5 drops into the left ear 2 (two) times daily. 15 mL 0   ofloxacin (FLOXIN OTIC) 0.3 % OTIC solution Place 5 drops into the left ear daily. 5 mL 0   cetirizine (ZYRTEC) 10 MG tablet Take 1 tablet (10 mg total) by mouth daily. 30 tablet 3   DERMA-SMOOTHE/FS SCALP 0.01 % OIL Apply topically at nighttime. 118 mL 1   hydrocortisone 1 % ointment Apply 1 application topically 2 (two) times daily as needed for itching. 30 g 0   ibuprofen (ADVIL) 600 MG tablet Take 1 tablet (600 mg total) by mouth every 8 (eight) hours as needed. 20 tablet 0   triamcinolone (KENALOG) 0.025 % cream Apply 1 application topically 2 (two) times daily. 30 g 0   No facility-administered medications prior to visit.    No Known Allergies  Review of Systems  Constitutional:  Negative for chills and fever.  HENT:  Negative for congestion, ear discharge, sinus pressure and sinus pain.   Respiratory:  Negative for cough and shortness of breath.   Cardiovascular:  Negative for chest pain and palpitations.  Gastrointestinal:  Negative for constipation, diarrhea and vomiting.  Genitourinary:  Negative for dysuria and  hematuria.  Musculoskeletal:  Negative for neck pain and neck stiffness.  Skin:  Negative for rash.  Neurological:  Negative for dizziness and weakness.  Psychiatric/Behavioral:  Negative for dysphoric mood. The patient is not nervous/anxious.       Objective:    Physical Exam Vitals reviewed.  Constitutional:      General: He is not in acute distress.    Appearance: He is not diaphoretic.  HENT:     Head: Normocephalic and atraumatic.     Right Ear: Ear canal and external ear normal. There is no impacted cerumen.     Left Ear: Ear canal and external ear normal. There is no impacted cerumen.     Nose: Nose normal.     Mouth/Throat:     Mouth: Mucous membranes are moist.   Eyes:     General: No scleral icterus.    Extraocular Movements: Extraocular movements intact.     Pupils: Pupils are equal, round, and reactive to light.  Cardiovascular:     Rate and Rhythm: Normal rate and regular rhythm.     Heart sounds: Normal heart sounds. No murmur heard. Pulmonary:     Breath sounds: Normal breath sounds. No wheezing.  Musculoskeletal:     Cervical back: Neck supple. No tenderness.  Skin:    General: Skin is warm.     Findings: No rash.  Neurological:     General: No focal deficit present.     Mental Status: He is alert and oriented to person, place, and time.  Psychiatric:        Mood and Affect: Mood normal.        Behavior: Behavior normal.    BP 122/84 (BP Location: Right Arm, Patient Position: Sitting, Cuff Size: Normal)    Pulse 67    Resp 18    Ht 5\' 9"  (1.753 m)    Wt 147 lb (66.7 kg)    SpO2 98%    BMI 21.71 kg/m  Wt Readings from Last 3 Encounters:  08/26/21 147 lb (66.7 kg)  07/29/21 144 lb 1.9 oz (65.4 kg)  03/19/21 141 lb 1.3 oz (64 kg) (26 %, Z= -0.63)*   * Growth percentiles are based on CDC (Boys, 2-20 Years) data.        Assessment & Plan:   Problem List Items Addressed This Visit       Nervous and Auditory   Left chronic serous otitis media - Primary    Referred to ENT for persistent symptoms Has been treated with otic Ofloxacin drops for otitis externa in the past, ear exam overall benign today      Relevant Orders   Ambulatory referral to ENT     No orders of the defined types were placed in this encounter.    Lindell Spar, MD

## 2021-09-25 NOTE — Progress Notes (Deleted)
Psychiatric Initial Adult Assessment  ? ?Patient Identification: Joshua Gilmore ?MRN:  [id number] ?Date of Evaluation:  09/25/2021 ?Referral Source: *** ?Chief Complaint:  No chief complaint on file. ? ?Visit Diagnosis: No diagnosis found. ? ?History of Present Illness:   ?Joshua Gilmore is a 21 y.o. year old male with a history of mild intellectual developmental disorder, autism, ADHD, who is referred for autism spectrum disorder.   ? ? ? ? ?  ? ?Associated Signs/Symptoms: ?Depression Symptoms:  {DEPRESSION SYMPTOMS:20000} ?(Hypo) Manic Symptoms:  {BHH MANIC SYMPTOMS:22872} ?Anxiety Symptoms:  {BHH ANXIETY SYMPTOMS:22873} ?Psychotic Symptoms:  {BHH PSYCHOTIC SYMPTOMS:22874} ?PTSD Symptoms: ?{BHH PTSD SYMPTOMS:22875} ? ?Past Psychiatric History:  ?Outpatient:  ?Psychiatry admission:  ?Previous suicide attempt:  ?Past trials of medication:  ?History of violence:   ? ?Previous Psychotropic Medications: {YES/NO:21197} ? ?Substance Abuse History in the last 12 months:  {yes no:314532} ? ?Consequences of Substance Abuse: ?{BHH CONSEQUENCES OF SUBSTANCE ABUSE:22880} ? ?Past Medical History:  ?Past Medical History:  ?Diagnosis Date  ? ADHD (attention deficit hyperactivity disorder)   ? Allergic rhinitis   ? Autism spectrum disorder   ? Generalized anxiety disorder 05/07/2013  ? No past surgical history on file. ? ?Family Psychiatric History: *** ? ?Family History:  ?Family History  ?Problem Relation Age of Onset  ? Hypertension Mother   ? Learning disabilities Sister   ? ? ?Social History:   ?Social History  ? ?Socioeconomic History  ? Marital status: Single  ?  Spouse name: Not on file  ? Number of children: Not on file  ? Years of education: Not on file  ? Highest education level: Not on file  ?Occupational History  ? Occupation: Housekeeping  ?  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 Smoking status: Never  ? Smokeless tobacco: Never  ?Substance and Sexual Activity  ? Alcohol use: Never  ? Drug use: Never  ? Sexual  activity: Not on file  ?Other Topics Concern  ? Not on file  ?Social History Narrative  ? Lives with mom,sister away at college  ? ?Social Determinants of Health  ? ?Financial Resource Strain: Not on file  ?Food Insecurity: Not on file  ?Transportation Needs: Not on file  ?Physical Activity: Not on file  ?Stress: Not on file  ?Social Connections: Not on file  ? ? ?Additional Social History: *** ? ?Allergies:  No Known Allergies ? ?Metabolic Disorder Labs: ?No results found for: HGBA1C, MPG ?No results found for: PROLACTIN ?Lab Results  ?Component Value Date  ? CHOL 160 05/02/2020  ? TRIG 52 05/02/2020  ? HDL 62 05/02/2020  ? CHOLHDL 2.6 05/02/2020  ? LDLCALC 87 05/02/2020  ? ?Lab Results  ?Component Value Date  ? TSH 0.865 07/29/2021  ? ? ?Therapeutic Level Labs: ?No results found for: LITHIUM ?No results found for: CBMZ ?No results found for: VALPROATE ? ?Current Medications: ?Current Outpatient Medications  ?Medication Sig Dispense Refill  ? carbamide peroxide (DEBROX) 6.5 % OTIC solution Place 5 drops into the left ear 2 (two) times daily. 15 mL 0  ? ofloxacin (FLOXIN OTIC) 0.3 % OTIC solution Place 5 drops into the left ear daily. 5 mL 0  ? ?No current facility-administered medications for this visit.  ? ? ?Musculoskeletal: ?Strength & Muscle Tone: within normal limits ?Gait & Station: normal ?Patient leans: N/A ? ?Psychiatric Specialty Exam: ?Review of Systems  ?There were no vitals taken for this visit.There is no height or weight on file to calculate BMI.  ?General Appearance: {Appearance:22683}  ?Eye  Contact:  {BHH EYE CONTACT:22684}  ?Speech:  Clear and Coherent  ?Volume:  Normal  ?Mood:  {BHH MOOD:22306}  ?Affect:  {Affect (PAA):22687}  ?Thought Process:  Coherent  ?Orientation:  Full (Time, Place, and Person)  ?Thought Content:  Logical  ?Suicidal Thoughts:  {ST/HT (PAA):22692}  ?Homicidal Thoughts:  {ST/HT (PAA):22692}  ?Memory:  Immediate;   Good  ?Judgement:  {Judgement (PAA):22694}  ?Insight:   {Insight (PAA):22695}  ?Psychomotor Activity:  Normal  ?Concentration:  Concentration: Good and Attention Span: Good  ?Recall:  Good  ?Fund of Knowledge:Good  ?Language: Good  ?Akathisia:  No  ?Handed:  Right  ?AIMS (if indicated):  not done  ?Assets:  Communication Skills ?Desire for Improvement  ?ADL's:  Intact  ?Cognition: WNL  ?Sleep:  {BHH GOOD/FAIR/POOR:22877}  ? ?Screenings: ?PHQ2-9   ? ?Flowsheet Row Office Visit from 08/26/2021 in [facility] Office Visit from 07/29/2021 in [facility] Office Visit from 03/19/2021 in [facility] Office Visit from 12/10/2020 in [facility] Office Visit from 10/22/2020 in [facility]  ?PHQ-2 Total Score 0 0 0 0 1  ? ?  ? ?Flowsheet Row ED from 03/01/2021 in [facility] [place]  ?C-SSRS RISK CATEGORY No Risk  ? ?  ? ? ?Assessment and Plan:  ?Assessment ? ?Plan ? ? ?The patient demonstrates the following risk factors for suicide: Chronic risk factors for suicide include: {Chronic Risk Factors for LYYTKPT:46568127}. Acute risk factors for suicide include: {Acute Risk Factors for NTZGYFV:49449675}. Protective factors for this patient include: {Protective Factors for Suicide FFMB:84665993}. Considering these factors, the overall suicide risk at this point appears to be {Desc; low/moderate/high:110033}. Patient {ACTION; IS/IS TTS:17793903} appropriate for outpatient follow up.  ? ? ? ?  ? ?Collaboration of Care: {BH OP Collaboration of Care:21014065} ? ?Patient/Guardian was advised Release of Information must be obtained prior to any record release in order to collaborate their care with an outside provider. Patient/Guardian was advised if they have not already done so to contact the registration department to sign all necessary forms in order for [place] to release information regarding their care.  ? ?Consent: Patient/Guardian gives verbal consent for treatment and assignment of benefits for services provided during  this visit. Patient/Guardian expressed understanding and agreed to proceed.  ? ?Neysa Hotter, MD ?3/10/20232:49 PM ? ?

## 2021-09-28 ENCOUNTER — Ambulatory Visit: Payer: Self-pay | Admitting: Psychiatry

## 2021-10-19 NOTE — Progress Notes (Signed)
Psychiatric Initial Adult Assessment  ? ?Patient Identification: Joshua Gilmore ?MRN:  [id number] ?Date of Evaluation:  10/21/2021 ?Referral Source: Anabel Halon, MD  ?Chief Complaint:   ?Chief Complaint  ?Patient presents with  ? Other  ? Establish Care  ? ?Visit Diagnosis:  ?  ICD-10-CM   ?1. Adjustment disorder with depressed mood  F43.21   ?  ?2. Autism spectrum disorder  F84.0   ?  ?3. Intellectual developmental disorder, mild  F70   ?  ? ? ?History of Present Illness:   ?Joshua Gilmore is a 21 y.o. year old male with a history of autism ADHD, mild intellectual developmental disorder, who is referred for autism.  ? ?Majority of the history was obtained with the help of his mother.  ?He states that he is doing "okay." He is unable to tell whey he comes to the clinic today. He enjoys doing video games, such as IT trainer.  He does not go outside as he is not that type of the person.  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  He states that he enjoyed going to elementary and middle school. It was difficult for him to keep up in high school.  It was difficult for him to ask for help, or doing certain tasks. When he is asked if he has any friends, he states that he has no friends.  Although he is interested in making friends, he states that he does "not spend time." When he is asked to elaborate, he states that he is "not sure." His mother states that there is an acquaintance called Lawanna Kobus, who he know through speech therapy.  When he was asked about this person, he states that he is not really think he can be a friend. He states that he is "in trouble with that one," although he is unable to elaborate it.  He states that he is not good at "changing things." He has "not moved on from the past," then states he is "really not sure" when he is asked to elaborate it. He states that he does not play soccer anymore since COVID. He "shut  down" since COVID, and it is "still hard to open again." When he is asked how so, he states that " things has changed.  I'm going to [place] and other stores. .And new things, I'm not used to new things, like technology and arts and crafts."  ? ?He has depressive symptoms as in PHQ-9.  He denies SI.  He denies anxiety at the current moment.  He denies any hallucinations, paranoia, ideas of reference, or thought insertion. He would like to see a therapist. He hopes to enjoy things more.  ? ?His mother states that his PCP referred him as he has autism. She wants him to be more independent. She states that he has estranged relationship with his father. He has a father figure/her boyfriend since he is [age] year old.  He gets along very well with him. She states that she thinks he thinks others can read his mind.  When she was asked the reason, she states that he does not answer questions to others. He tends to stare and quiet.  Although he talks outoud to himself, there is no obvious episodes of him having hallucinations otherwise. She denies any aggression of safety concern.  ? ?Developmental history- he was born 42 weeks without complication.  He  was diagnosed with autism at age 89. He started to see a speech therapist. He was able to speech full sentences at 71six year old. He has a few cousins on his mother side, who has autism spectrum disorder.  ? ? ?Wt Readings from Last 3 Encounters:  ?10/21/21 144 lb 6.4 oz (65.5 kg)  ?08/26/21 147 lb (66.7 kg)  ?07/29/21 144 lb 1.9 oz (65.4 kg)  ?  ? ?Daily routine: works 8am-4pm, plays video games ?Exercise: none ?Support: mother ?Household: mother ?Marital status: single ?Number of children: 0  ?Employment: housekeeping ?Education:  high school ?Last PCP / ongoing medical evaluation:   ?He has one sister ? ?Associated Signs/Symptoms: ?Depression Symptoms:  depressed mood, ?anhedonia, ?insomnia, ?difficulty concentrating, ?(Hypo) Manic Symptoms:   denies decreased need for sleep,  euphoria ?Anxiety Symptoms:   mild anxiety  ?Psychotic Symptoms:   denies AH, VH, paranoia, ideas of reference ?PTSD Symptoms: ?Negative ? ?Past Psychiatric History:  ?Outpatient:  ?Psychiatry admission: denies ?Previous suicide attempt: denies ?Past trials of medication: Vyvanse ?History of violence:  denies ? ?Previous Psychotropic Medications: Yes  ? ?Substance Abuse History in the last 12 months:  No. ? ?Consequences of Substance Abuse: ?NA ? ?Past Medical History:  ?Past Medical History:  ?Diagnosis Date  ? ADHD (attention deficit hyperactivity disorder)   ? Allergic rhinitis   ? Autism spectrum disorder   ? Generalized anxiety disorder 05/07/2013  ? History reviewed. No pertinent surgical history. ? ?Family Psychiatric History: as below ? ?Family History:  ?Family History  ?Problem Relation Age of Onset  ? Hypertension Mother   ? Learning disabilities Sister   ? Alcohol abuse Maternal Uncle   ? Alcohol abuse Paternal Uncle   ? Autism spectrum disorder Cousin   ? ? ?Social History:   ?Social History  ? ?Socioeconomic History  ? Marital status: Single  ?  Spouse name: Not on file  ? Number of children: 0  ? Years of education: Not on file  ? Highest education level: High school graduate  ?Occupational History  ? Occupation: Housekeeping  ?  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 Smoking status: Never  ? Smokeless tobacco: Never  ?Vaping Use  ? Vaping Use: Never used  ?Substance and Sexual Activity  ? Alcohol use: Never  ? Drug use: Never  ? Sexual activity: Not Currently  ?Other Topics Concern  ? Not on file  ?Social History Narrative  ? Lives with mom,sister away at college  ? ?Social Determinants of Health  ? ?Financial Resource Strain: Not on file  ?Food Insecurity: Not on file  ?Transportation Needs: Not on file  ?Physical Activity: Not on file  ?Stress: Not on file  ?Social Connections: Not on file  ? ? ?Additional Social History: as above ? ?Allergies:  No Known Allergies ? ?Metabolic Disorder Labs: ?No  results found for: HGBA1C, MPG ?No results found for: PROLACTIN ?Lab Results  ?Component Value Date  ? CHOL 160 05/02/2020  ? TRIG 52 05/02/2020  ? HDL 62 05/02/2020  ? CHOLHDL 2.6 05/02/2020  ? LDLCALC 87 05/02/2020  ? ?Lab Results  ?Component Value Date  ? TSH 0.865 07/29/2021  ? ? ?Therapeutic Level Labs: ?No results found for: LITHIUM ?No results found for: CBMZ ?No results found for: VALPROATE ? ?Current Medications: ?Current Outpatient Medications  ?Medication Sig Dispense Refill  ? [START ON 10/22/2021] buPROPion (WELLBUTRIN XL) 150 MG 24 hr tablet Take 1 tablet (150 mg total) by mouth daily. 30 tablet 1  ? ?  No current facility-administered medications for this visit.  ? ? ?Musculoskeletal: ?Strength & Muscle Tone:  N/A ?Gait & Station:  N/A ?Patient leans: N/A ? ?Psychiatric Specialty Exam: ?Review of Systems  ?Psychiatric/Behavioral:  Positive for decreased concentration, dysphoric mood and sleep disturbance. Negative for agitation, behavioral problems, confusion, hallucinations, self-injury and suicidal ideas. The patient is nervous/anxious. The patient is not hyperactive.   ?All other systems reviewed and are negative.  ?Blood pressure 126/74, pulse (!) 59, temperature 98.7 ?F (37.1 ?C), temperature source Temporal, weight 144 lb 6.4 oz (65.5 kg).Body mass index is 21.32 kg/m?.  ?General Appearance: Fairly Groomed  ?Eye Contact:  Fair  ?Speech:  Clear and Coherent,paucity of speech  ?Volume:  Normal  ?Mood:   good  ?Affect:  Appropriate, Congruent, and Restricted, smiles a times  ?Thought Process:  Coherent  ?Orientation:  Full (Time, Place, and Person)  ?Thought Content:  Logical and Tangential  ?Suicidal Thoughts:  No  ?Homicidal Thoughts:  No  ?Memory:  Immediate;   Good  ?Judgement:  Good  ?Insight:  Present  ?Psychomotor Activity:  Normal  ?Concentration:  Concentration: Good and Attention Span: Good  ?Recall:  Good  ?Fund of Knowledge:Good  ?Language: Good  ?Akathisia:  No  ?Handed:  Right  ?AIMS (if  indicated):  not done  ?Assets:  Communication Skills ?Desire for Improvement  ?ADL's:  Intact  ?Cognition: WNL  ?Sleep:  Fair  ? ?Screenings: ?PHQ2-9   ? ?Flowsheet Row Office Visit from 10/21/2021 in [place]

## 2021-10-21 ENCOUNTER — Telehealth: Payer: Self-pay | Admitting: Psychiatry

## 2021-10-21 ENCOUNTER — Encounter: Payer: Self-pay | Admitting: Psychiatry

## 2021-10-21 ENCOUNTER — Telehealth (HOSPITAL_COMMUNITY): Payer: Self-pay | Admitting: Psychiatry

## 2021-10-21 ENCOUNTER — Ambulatory Visit (INDEPENDENT_AMBULATORY_CARE_PROVIDER_SITE_OTHER): Payer: 59 | Admitting: Psychiatry

## 2021-10-21 VITALS — BP 126/74 | HR 59 | Temp 98.7°F | Wt 144.4 lb

## 2021-10-21 DIAGNOSIS — F840 Autistic disorder: Secondary | ICD-10-CM | POA: Diagnosis not present

## 2021-10-21 DIAGNOSIS — F4321 Adjustment disorder with depressed mood: Secondary | ICD-10-CM

## 2021-10-21 DIAGNOSIS — F70 Mild intellectual disabilities: Secondary | ICD-10-CM

## 2021-10-21 MED ORDER — BUPROPION HCL ER (XL) 150 MG PO TB24: 150.0000 mg | ORAL_TABLET | Freq: Every day | ORAL | 1 refills | Status: DC

## 2021-10-21 NOTE — Telephone Encounter (Signed)
Patient's mother came back into the office stating patient had reconsidered and is willing to try Wellbutrin. Requests prescription be sent to [facility] on [address] in [place]. ?

## 2021-10-21 NOTE — Telephone Encounter (Signed)
Called to schedule NP appt per Dr. Vanetta Shawl left detailed vm for pt to return call and schedule  ?

## 2021-10-21 NOTE — Telephone Encounter (Signed)
Left the voice message to call back. He was adamant not to take it earlier, so will hold ordering it until I hear back from him.

## 2021-11-28 ENCOUNTER — Other Ambulatory Visit: Payer: Self-pay | Admitting: Psychiatry
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
## 2021-12-21 ENCOUNTER — Encounter: Payer: Self-pay | Admitting: Psychiatry

## 2021-12-21 ENCOUNTER — Ambulatory Visit (INDEPENDENT_AMBULATORY_CARE_PROVIDER_SITE_OTHER): Payer: 59 | Admitting: Psychiatry

## 2021-12-21 VITALS — BP 138/85 | HR 72 | Temp 98.6°F | Wt 147.0 lb

## 2021-12-21 DIAGNOSIS — F70 Mild intellectual disabilities: Secondary | ICD-10-CM | POA: Diagnosis not present

## 2021-12-21 DIAGNOSIS — F4323 Adjustment disorder with mixed anxiety and depressed mood: Secondary | ICD-10-CM

## 2021-12-21 DIAGNOSIS — F840 Autistic disorder: Secondary | ICD-10-CM

## 2021-12-21 MED ORDER — BUPROPION HCL ER (XL) 150 MG PO TB24: 150.0000 mg | ORAL_TABLET | Freq: Every day | ORAL | 1 refills | Status: DC

## 2022-01-26 ENCOUNTER — Ambulatory Visit: Payer: Managed Care, Other (non HMO) | Admitting: Internal Medicine

## 2022-01-27 ENCOUNTER — Ambulatory Visit: Payer: Managed Care, Other (non HMO) | Admitting: Internal Medicine

## 2022-01-27 ENCOUNTER — Encounter: Payer: Self-pay | Admitting: Internal Medicine

## 2022-01-27 VITALS — BP 112/84 | HR 91 | Resp 18 | Ht 72.0 in | Wt 139.8 lb

## 2022-01-27 DIAGNOSIS — F418 Other specified anxiety disorders: Secondary | ICD-10-CM | POA: Insufficient documentation

## 2022-01-27 DIAGNOSIS — F840 Autistic disorder: Secondary | ICD-10-CM | POA: Diagnosis not present

## 2022-01-27 DIAGNOSIS — J020 Streptococcal pharyngitis: Secondary | ICD-10-CM | POA: Diagnosis not present

## 2022-01-27 MED ORDER — PHENOL 1.4 % MT LIQD: 1.0000 | OROMUCOSAL | 0 refills | Status: DC | PRN

## 2022-01-27 NOTE — Patient Instructions (Signed)
Please use Phenol throat spray for sore throat.  Please perform warm water gargling for sore throat.  Please continue taking other medications as prescribed.

## 2022-01-27 NOTE — Progress Notes (Signed)
Established Patient Office Visit  Subjective:  Patient ID: Joshua Gilmore, male    DOB: 09-24-2000  Age: 21 y.o. MRN: [id number]  CC:  Chief Complaint  Patient presents with   Follow-up    6 month follow up pt went to urgent care 01-22-22 pos for strep was given antibiotic throat is still sore     HPI Joshua Gilmore is a 21 y.o. male with past medical history of autism spectrum disorder and alopecia who presents for f/u of his chronic medical conditions.  He complains of sore throat, but denies fever, chills, nasal congestion congestion, postnasal drip, dyspnea or wheezing currently.  He went to urgent care and had positive strep test.  He is on amoxicillin currently.  He is followed by psychiatry - Dr. Modesta Messing for history of autism spectrum disorder and adjustment disorder with depression and anxiety.  He tolerates Wellbutrin well.  Past Medical History:  Diagnosis Date   ADHD (attention deficit hyperactivity disorder)    Allergic rhinitis    Autism spectrum disorder    Generalized anxiety disorder 05/07/2013    History reviewed. No pertinent surgical history.  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  Tobacco Use   Smoking status: Never   Smokeless tobacco: Never  Vaping Use   Vaping Use: Never used  Substance and Sexual Activity   Alcohol use: Never   Drug use: Never   Sexual activity: Not Currently  Other Topics Concern   Not on file  Social History Narrative   Lives with mom,sister away at college   Social Determinants of  Health   Financial Resource Strain: [place]  (04/23/2020)   Overall Financial Resource Strain (CARDIA)    Difficulty of Paying Living Expenses: Not hard at all  Food Insecurity: No Food Insecurity (04/23/2020)   Hunger Vital Sign    Worried About Running Out of Food in the Last Year: Never true    [place] in the Last Year: Never true  Transportation Needs: No Transportation Needs (04/23/2020)   PRAPARE - Hydrologist (Medical): No    Lack of Transportation (Non-Medical): No  Physical Activity: Insufficiently Active (04/23/2020)   Exercise Vital Sign    Days of Exercise per Week: 3 days    Minutes of Exercise per Session: 20 min  Stress: No Stress Concern Present (04/23/2020)   [place]    Feeling of Stress : Not at all  Social Connections: Socially Isolated (04/23/2020)   Social Connection and Isolation Panel [NHANES]    Frequency of Communication with Friends and Family: More than three times a week    Frequency of Social Gatherings with Friends and Family: More than three times a week    Attends Religious Services: Never    Marine scientist or Organizations: No    Attends Archivist Meetings: Never    Marital Status: Never married  Intimate  Partner Violence: Not At Risk (04/23/2020)   Humiliation, Afraid, Rape, and Kick questionnaire    Fear of Current or Ex-Partner: No    Emotionally Abused: No    Physically Abused: No    Sexually Abused: No    Outpatient Medications Prior to Visit  Medication Sig Dispense Refill   amoxicillin (AMOXIL) 875 MG tablet Take by mouth.     buPROPion (WELLBUTRIN XL) 150 MG 24 hr tablet Take 1 tablet (150 mg total) by mouth daily. 30 tablet 1   No facility-administered medications prior to visit.    No Known Allergies  ROS Review of Systems  Constitutional:  Negative for chills and fever.  HENT:  Positive for sore throat.  Negative for congestion, ear discharge, sinus pressure and sinus pain.   Respiratory:  Negative for cough and shortness of breath.   Cardiovascular:  Negative for chest pain and palpitations.  Gastrointestinal:  Negative for constipation, diarrhea and vomiting.  Genitourinary:  Negative for dysuria and hematuria.  Musculoskeletal:  Negative for neck pain and neck stiffness.  Skin:  Negative for rash.  Neurological:  Negative for dizziness and weakness.  Psychiatric/Behavioral:  Negative for dysphoric mood. The patient is not nervous/anxious.       Objective:    Physical Exam Vitals reviewed.  Constitutional:      General: He is not in acute distress.    Appearance: He is not diaphoretic.  HENT:     Head: Normocephalic and atraumatic.     Nose: Nose normal.     Mouth/Throat:     Mouth: Mucous membranes are moist.     Pharynx: Posterior oropharyngeal erythema present.  Eyes:     General: No scleral icterus.    Extraocular Movements: Extraocular movements intact.  Cardiovascular:     Rate and Rhythm: Normal rate and regular rhythm.     Heart sounds: Normal heart sounds. No murmur heard. Pulmonary:     Breath sounds: Normal breath sounds. No wheezing.  Musculoskeletal:     Cervical back: Neck supple. No tenderness.  Skin:    General: Skin is warm.     Findings: No rash.  Neurological:     General: No focal deficit present.     Mental Status: He is alert and oriented to person, place, and time.  Psychiatric:        Mood and Affect: Mood normal.        Behavior: Behavior normal.     BP 112/84 (BP Location: Right Arm, Patient Position: Sitting, Cuff Size: Normal)   Pulse 91   Resp 18   Ht 6' (1.829 m)   Wt 139 lb 12.8 oz (63.4 kg)   SpO2 99%   BMI 18.96 kg/m  Wt Readings from Last 3 Encounters:  01/27/22 139 lb 12.8 oz (63.4 kg)  12/21/21 147 lb (66.7 kg)  10/21/21 144 lb 6.4 oz (65.5 kg)    Lab Results  Component Value Date   TSH 0.865 07/29/2021   Lab  Results  Component Value Date   WBC 3.4 07/29/2021   HGB 15.2 07/29/2021   HCT 47.0 07/29/2021   MCV 82 07/29/2021   PLT 312 07/29/2021   Lab Results  Component Value Date   NA 140 07/29/2021   K 4.3 07/29/2021   CO2 25 07/29/2021   GLUCOSE 80 07/29/2021   BUN 12 07/29/2021   CREATININE 1.05 07/29/2021   BILITOT 2.1 (H) 07/29/2021   ALKPHOS 72 07/29/2021   AST 20 07/29/2021   ALT  14 07/29/2021   PROT 7.8 07/29/2021   ALBUMIN 5.4 (H) 07/29/2021   CALCIUM 10.2 07/29/2021   EGFR 104 07/29/2021   Lab Results  Component Value Date   CHOL 160 05/02/2020   Lab Results  Component Value Date   HDL 62 05/02/2020   Lab Results  Component Value Date   LDLCALC 87 05/02/2020   Lab Results  Component Value Date   TRIG 52 05/02/2020   Lab Results  Component Value Date   CHOLHDL 2.6 05/02/2020   No results found for: "HGBA1C"    Assessment & Plan:   Problem List Items Addressed This Visit       Other   Autism spectrum disorder - Primary    Followed by psychiatry - Dr. Modesta Messing      Depression with anxiety    Well controlled with Wellbutrin Followed by psychiatry - Dr. Modesta Messing      Other Visit Diagnoses     Strep pharyngitis     Urgent care visit note reviewed On amoxicillin Added phenol throat spray for sore throat Advised to perform warm water gargling   Relevant Medications   phenol (CHLORASEPTIC) 1.4 % LIQD       Meds ordered this encounter  Medications   phenol (CHLORASEPTIC) 1.4 % LIQD    Sig: Use as directed 1 spray in the mouth or throat as needed for throat irritation / pain.    Dispense:  177 mL    Refill:  0    Follow-up: Return in about 6 months (around 07/30/2022) for Annual physical.    Lindell Spar, MD

## 2022-01-27 NOTE — Assessment & Plan Note (Signed)
Well controlled with Wellbutrin Followed by psychiatry - Dr. Hisada 

## 2022-01-27 NOTE — Assessment & Plan Note (Signed)
Followed by psychiatry - Dr. Vanetta Shawl

## 2022-02-10 NOTE — Progress Notes (Signed)
BH MD/PA/NP OP Progress Note  02/15/2022 10:40 AM Joshua Gilmore  MRN:  [id number]  Chief Complaint:  Chief Complaint  Patient presents with   Follow-up   HPI:  This is a follow-up appointment for adjustment disorder.   He states that he has been doing good.  He continues to complain at work.  He is hoping to have a balance so that he does not get too busy, but not get too slow.  He states that his mother broke up with her boyfriend.  He has been trying to help her as much as he can such as working on the house chores so that she does not need to be concerned.  He states that this boyfriend asked him to make a payment for the hotel as he was a person who wanted to go to [facility].  Although he was like a father figure and Joshua Gilmore had a fair relationship with him, he is fine with this break up. He states that his mother and her ex-boyfriend tend to dismiss him. His grandmother can "blackmail" the next day she was nice to him. He wants to work on the insecurity.  He tends to feel jealous of others.  He has been helping the male friend at work, who has ADHD.  She does not respond back to him on text message, and he tries to respect the boundary.  He denies feeling depressed or anxiety.  He thinks bupropion has been working well.  He denies SI, HI, any hallucinations.  He denies alcohol use or drug use.  He feels comfortable to stay on the current medication regimen.   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 Adjustment disorder with mixed anxiety and depressed mood  F43.23 buPROPion (WELLBUTRIN XL) 150 MG 24 hr tablet      Past  Psychiatric History: Please see initial evaluation for full details. I have reviewed the history. No updates at this time.     Past Medical History:  Past Medical History:  Diagnosis Date   ADHD (attention deficit hyperactivity disorder)    Allergic rhinitis    Autism spectrum disorder    Generalized anxiety disorder 05/07/2013   History reviewed. No pertinent surgical history.  Family Psychiatric History: Please see initial evaluation for full details. I have reviewed the history. No updates at this time.     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  Tobacco Use   Smoking status: Never   Smokeless tobacco: Never  Vaping Use   Vaping Use: Never used  Substance and Sexual Activity  Alcohol use: Never   Drug use: Never   Sexual activity: Not Currently  Other Topics Concern   Not on file  Social History Narrative   Lives with mom,sister away at college   Social Determinants of Health   Financial Resource Strain: Low Risk  (04/23/2020)   Overall Financial Resource Strain (CARDIA)    Difficulty of Paying Living Expenses: Not hard at all  Food Insecurity: No Food Insecurity (04/23/2020)   Hunger Vital Sign    Worried About Running Out of Food in the Last Year: Never true    Ran Out of Food in the Last Year: Never true  Transportation Needs: No Transportation Needs (04/23/2020)   PRAPARE - Administrator, Civil Service (Medical): No    Lack of Transportation (Non-Medical): No  Physical  Activity: Insufficiently Active (04/23/2020)   Exercise Vital Sign    Days of Exercise per Week: 3 days    Minutes of Exercise per Session: 20 min  Stress: No Stress Concern Present (04/23/2020)   [employer] of Occupational Health - Occupational Stress Questionnaire    Feeling of Stress : Not at all  Social Connections: Socially Isolated (04/23/2020)   Social Connection and Isolation Panel [NHANES]    Frequency of Communication with Friends and Family: More than three times a week    Frequency of Social Gatherings with Friends and Family: More than three times a week    Attends Religious Services: Never    Database administrator or Organizations: No    Attends Engineer, structural: Never    Marital Status: Never married    Allergies: No Known Allergies  Metabolic Disorder Labs: No results found for: "HGBA1C", "MPG" No results found for: "PROLACTIN" Lab Results  Component Value Date   CHOL 160 05/02/2020   TRIG 52 05/02/2020   HDL 62 05/02/2020   CHOLHDL 2.6 05/02/2020   LDLCALC 87 05/02/2020   Lab Results  Component Value Date   TSH 0.865 07/29/2021   TSH 1.100 05/02/2020    Therapeutic Level Labs: No results found for: "LITHIUM" No results found for: "VALPROATE" No results found for: "CBMZ"  Current Medications: Current Outpatient Medications  Medication Sig Dispense Refill   phenol (CHLORASEPTIC) 1.4 % LIQD Use as directed 1 spray in the mouth or throat as needed for throat irritation / pain. 177 mL 0   buPROPion (WELLBUTRIN XL) 150 MG 24 hr tablet Take 1 tablet (150 mg total) by mouth daily. 30 tablet 2   No current facility-administered medications for this visit.     Musculoskeletal: Strength & Muscle Tone: within normal limits Gait & Station: normal Patient leans: N/A  Psychiatric Specialty Exam: Review of Systems  Psychiatric/Behavioral: Negative.    All other systems reviewed and are negative.   Blood pressure 124/76, pulse 64,  temperature 98.3 F (36.8 C), temperature source Temporal, weight 141 lb 12.8 oz (64.3 kg).Body mass index is 19.23 kg/m.  General Appearance: Fairly Groomed  Eye Contact:  Good  Speech:  Clear and Coherent  Volume:  Normal  Mood:   good  Affect:  Appropriate, Congruent, and calm, smiles  Thought Process:  Coherent  Orientation:  Full (Time, Place, and Person)  Thought Content: Logical   Suicidal Thoughts:  No  Homicidal Thoughts:  No  Memory:  Immediate;   Good  Judgement:  Good  Insight:  Good  Psychomotor Activity:  Normal  Concentration:  Concentration: Good and Attention Span: Good  Recall:  Good  Fund of Knowledge: Good  Language: Good  Akathisia:  No  Handed:  Right  AIMS (if indicated): not done  Assets:  Communication Skills Desire for Improvement  ADL's:  Intact  Cognition: WNL  Sleep:  Good   Screenings: GAD-7    Flowsheet Row Office Visit from 02/15/2022 in [place]  Total GAD-7 Score 0      PHQ2-9    [place] Office Visit from 02/15/2022 in [place] Office Visit from 01/27/2022 in [facility] Office Visit from 12/21/2021 in [place] Office Visit from 10/21/2021 in [place] Office Visit from 08/26/2021 in [facility]  PHQ-2 Total Score 1 0 3 3 0  PHQ-9 Total Score 4 -- 3 6 --      [place] Office Visit from 02/15/2022 in [place] Office Visit from 12/21/2021 in [place] Office Visit from 10/21/2021 in [place] No Risk No Risk No Risk        Assessment and Plan:  Brayen Hierholzer is a 21 y.o. year old male with a history of autism, ADHD, mild intellectual developmental disorder,, who presents for follow up appointment for below.   1. Adjustment disorder with mixed anxiety and depressed  mood There has been steady improvement in his mood since starting bupropion. Psychosocial stressors includes loneliness.  Will continue current dose of bupropion to target depression.  He will greatly benefit from CBT; will make referral again.    2. Autism spectrum disorder 3. Intellectual developmental disorder, mild He demonstrates good insight into his childhood, and understanding of his current environment.  He is hoping to have a career in filming. Although he demonstrates some tangential thought process on initial visit, it may have been more attributable to anxiety. He has not displayed any symptoms to be concerned of psychotic disorder on today's evaluation.  There is no safety concern or aggression reported by his caregiver.    Plan Continue bupropion 150 mg daily  Referral to therapy to [place] office Next appointment: 10/26 at 11 AM for 30 mins, in person   The patient demonstrates the following risk factors for suicide: Chronic risk factors for suicide include: psychiatric disorder of autism, ADHD . Acute risk factors for suicide include: social withdrawal/isolation. Protective factors for this patient include: positive social support. Considering these factors, the overall suicide risk at this point appears to be low. Patient is appropriate for outpatient follow up.        Collaboration of Care: Collaboration of Care: Other N/A  Patient/Guardian was advised Release of Information must be obtained prior to any record release in order to collaborate their care with an outside provider. Patient/Guardian was advised if they have not already done so to contact the registration department to sign all necessary forms in order for [place] to release information regarding their care.   Consent: Patient/Guardian gives verbal consent for treatment and assignment of benefits for services provided during this visit. Patient/Guardian expressed understanding and agreed to proceed.    Norman Clay, MD 02/15/2022, 10:40 AM

## 2022-02-15 ENCOUNTER — Encounter: Payer: Self-pay | Admitting: Psychiatry

## 2022-02-15 ENCOUNTER — Ambulatory Visit (INDEPENDENT_AMBULATORY_CARE_PROVIDER_SITE_OTHER): Payer: 59 | Admitting: Psychiatry

## 2022-02-15 VITALS — BP 124/76 | HR 64 | Temp 98.3°F | Wt 141.8 lb

## 2022-02-15 DIAGNOSIS — F4323 Adjustment disorder with mixed anxiety and depressed mood: Secondary | ICD-10-CM | POA: Diagnosis not present

## 2022-02-15 MED ORDER — BUPROPION HCL ER (XL) 150 MG PO TB24: 150.0000 mg | ORAL_TABLET | Freq: Every day | ORAL | 2 refills | Status: DC

## 2022-02-15 NOTE — Patient Instructions (Signed)
Continue bupropion 150 mg daily  Referral to therapy to [place] office Next appointment: 10/26 at 11 AM
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
## 2022-05-13 ENCOUNTER — Ambulatory Visit (INDEPENDENT_AMBULATORY_CARE_PROVIDER_SITE_OTHER): Payer: 59 | Admitting: Psychiatry

## 2022-05-13 ENCOUNTER — Encounter: Payer: Self-pay | Admitting: Psychiatry

## 2022-05-13 VITALS — BP 111/67 | HR 59 | Temp 98.8°F | Ht 72.0 in | Wt 144.8 lb

## 2022-05-13 DIAGNOSIS — F840 Autistic disorder: Secondary | ICD-10-CM | POA: Diagnosis not present

## 2022-05-13 DIAGNOSIS — F4323 Adjustment disorder with mixed anxiety and depressed mood: Secondary | ICD-10-CM

## 2022-05-13 DIAGNOSIS — F70 Mild intellectual disabilities: Secondary | ICD-10-CM | POA: Diagnosis not present

## 2022-05-13 MED ORDER — BUPROPION HCL ER (XL) 150 MG PO TB24: 150.0000 mg | ORAL_TABLET | Freq: Every day | ORAL | 2 refills | Status: DC

## 2022-05-13 NOTE — Patient Instructions (Signed)
Restart bupropion 150 mg daily  Please contact [place] office for therapy [phone] Next appointment: 1/22 at 11:30

## 2022-05-14 ENCOUNTER — Other Ambulatory Visit: Payer: Self-pay | Admitting: Psychiatry

## 2022-05-14 DIAGNOSIS — F4323 Adjustment disorder with mixed anxiety and depressed mood: Secondary | ICD-10-CM

## 2022-06-04 ENCOUNTER — Ambulatory Visit (INDEPENDENT_AMBULATORY_CARE_PROVIDER_SITE_OTHER): Payer: Managed Care, Other (non HMO)

## 2022-06-04 DIAGNOSIS — Z23 Encounter for immunization: Secondary | ICD-10-CM | POA: Diagnosis not present

## 2022-07-30 ENCOUNTER — Encounter: Payer: Managed Care, Other (non HMO) | Admitting: Internal Medicine

## 2022-08-04 NOTE — Progress Notes (Deleted)
[facility] MD/PA/NP OP Progress Note  08/04/2022 8:43 AM Joshua Gilmore  MRN:  [id number]  Chief Complaint: No chief complaint on file.  HPI: *** Visit Diagnosis: No diagnosis found.  Past Psychiatric History: Please see initial evaluation for full details. I have reviewed the history. No updates at this time.     Past Medical History:  Past Medical History:  Diagnosis Date   ADHD (attention deficit hyperactivity disorder)    Allergic rhinitis    Autism spectrum disorder    Generalized anxiety disorder 05/07/2013   No past surgical history on file.  Family Psychiatric History: Please see initial evaluation for full details. I have reviewed the history. No updates at this time.     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  Tobacco Use   Smoking status: Never   Smokeless tobacco: Never  Vaping Use   Vaping Use: Never used  Substance and Sexual Activity   Alcohol use: Never   Drug use: Never   Sexual activity: Not Currently  Other Topics Concern   Not on file  Social History Narrative   Lives with mom,sister away at college   Social Determinants of Health   Financial Resource Strain: [place]  (04/23/2020)   Overall Financial Resource Strain (CARDIA)    Difficulty of Paying Living Expenses: Not hard at all  Food Insecurity: No Food Insecurity (04/23/2020)   Hunger Vital Sign    Worried About Running Out of Food in the Last Year: Never true    [place] in the Last Year: Never true  Transportation  Needs: No Transportation Needs (04/23/2020)   PRAPARE - Hydrologist (Medical): No    Lack of Transportation (Non-Medical): No  Physical Activity: Insufficiently Active (04/23/2020)   Exercise Vital Sign    Days of Exercise per Week: 3 days    Minutes of Exercise per Session: 20 min  Stress: No Stress Concern Present (04/23/2020)   [place]    Feeling of Stress : Not at all  Social Connections: Socially Isolated (04/23/2020)   Social Connection and Isolation Panel [NHANES]    Frequency of Communication with Friends and Family: More than three times a week    Frequency of Social Gatherings with Friends and Family: More than three times a week    Attends Religious Services: Never    Marine scientist or Organizations: No    Attends Archivist Meetings: Never    Marital Status: Never married    Allergies: No Known Allergies  Metabolic Disorder Labs: No results found for: "HGBA1C", "MPG" No results found for: "PROLACTIN" Lab Results  Component Value Date   CHOL 160 05/02/2020   TRIG 52 05/02/2020   HDL 62 05/02/2020   CHOLHDL 2.6 05/02/2020   [place] 87 05/02/2020   Lab Results  Component Value Date   TSH 0.865 07/29/2021  TSH 1.100 05/02/2020    Therapeutic Level Labs: No results found for: "LITHIUM" No results found for: "VALPROATE" No results found for: "CBMZ"  Current Medications: Current Outpatient Medications  Medication Sig Dispense Refill   buPROPion (WELLBUTRIN XL) 150 MG 24 hr tablet Take 1 tablet (150 mg total) by mouth daily. 30 tablet 2   phenol (CHLORASEPTIC) 1.4 % LIQD Use as directed 1 spray in the mouth or throat as needed for throat irritation / pain. (Patient not taking: Reported on 05/13/2022) 177 mL 0   No current facility-administered medications for this visit.     Musculoskeletal: Strength & Muscle Tone: within normal limits Gait &  Station: normal Patient leans: N/A  Psychiatric Specialty Exam: Review of Systems  There were no vitals taken for this visit.There is no height or weight on file to calculate BMI.  General Appearance: {Appearance:22683}  Eye Contact:  {BHH EYE CONTACT:22684}  Speech:  Clear and Coherent  Volume:  Normal  Mood:  {BHH MOOD:22306}  Affect:  {Affect (PAA):22687}  Thought Process:  Coherent  Orientation:  Full (Time, Place, and Person)  Thought Content: Logical   Suicidal Thoughts:  {ST/HT (PAA):22692}  Homicidal Thoughts:  {ST/HT (PAA):22692}  Memory:  Immediate;   Good  Judgement:  {Judgement (PAA):22694}  Insight:  {Insight (PAA):22695}  Psychomotor Activity:  Normal  Concentration:  Concentration: Good and Attention Span: Good  Recall:  Good  Fund of Knowledge: Good  Language: Good  Akathisia:  No  Handed:  Right  AIMS (if indicated): not done  Assets:  Communication Skills Desire for Improvement  ADL's:  Intact  Cognition: WNL  Sleep:  {BHH GOOD/FAIR/POOR:22877}   Screenings: GAD-7    Flowsheet Row Office Visit from 05/13/2022 in [place] Visit from 02/15/2022 in [place]  Total GAD-7 Score 2 0      PHQ2-9    [place] Visit from 05/13/2022 in [place] Office Visit from 02/15/2022 in [place] Office Visit from 01/27/2022 in [facility] Office Visit from 12/21/2021 in [place] Office Visit from 10/21/2021 in [place]  PHQ-2 Total Score 1 1 0 3 3  PHQ-9 Total Score -- 4 -- 3 [place] Office Visit from 05/13/2022 in [place] Office Visit from 02/15/2022 in [place] Office Visit from 12/21/2021 in [place] No Risk No Risk No Risk         Assessment and Plan:  Joshua Gilmore is a 22 y.o. year old male with a history of autism, ADHD, mild intellectual developmental disorder, who presents for follow up appointment for below.    1. Adjustment disorder with mixed anxiety and depressed mood He denies significant mood symptoms despite recent stress at work.  Other psychosocial stressors includes loneliness.  He is not adherent to medication; he agrees to restart bupropion to target depression. He will greatly benefit from CBT.  Referral was made, and he still does not have an appointment.  He was given [place] office number to schedule for the appointment.    2. Autism spectrum disorder 3. Intellectual developmental disorder, mild He demonstrated good insight into his childhood, and understanding of his current environment.  Although he used to report interest in filming, he is now interested in becoming a doctor.  During visits since initial evaluation, he has not displayed symptoms to be  concerned of psychotic disorder except that he had some tangential thought process on initial visit, which be more attributable to anxiety.  No safety concern of aggression reported by his caregiver.      Plan Restart bupropion 150 mg daily  Referral to therapy to [place] office Next appointment: 1/22 at 11:30  for 30 mins, in person   The patient demonstrates the following risk factors for suicide: Chronic risk factors for suicide include: psychiatric disorder of autism, ADHD . Acute risk factors for suicide include: social withdrawal/isolation. Protective factors for this patient include: positive social support. Considering these factors, the overall suicide risk at this point appears to be low. Patient is appropriate for outpatient follow up.       Collaboration of Care: Collaboration of Care: {BH OP Collaboration of Care:21014065}  Patient/Guardian was advised Release of Information must be obtained prior to any record release  in order to collaborate their care with an outside provider. Patient/Guardian was advised if they have not already done so to contact the registration department to sign all necessary forms in order for [place] to release information regarding their care.   Consent: Patient/Guardian gives verbal consent for treatment and assignment of benefits for services provided during this visit. Patient/Guardian expressed understanding and agreed to proceed.    Norman Clay, MD 08/04/2022, 8:43 AM

## 2022-08-09 ENCOUNTER — Ambulatory Visit: Payer: 59 | Admitting: Psychiatry

## 2022-08-11 ENCOUNTER — Ambulatory Visit (INDEPENDENT_AMBULATORY_CARE_PROVIDER_SITE_OTHER): Payer: Managed Care, Other (non HMO) | Admitting: Internal Medicine

## 2022-08-11 ENCOUNTER — Encounter: Payer: Self-pay | Admitting: Internal Medicine

## 2022-08-11 VITALS — BP 130/78 | HR 69 | Ht 72.0 in | Wt 149.0 lb

## 2022-08-11 DIAGNOSIS — F418 Other specified anxiety disorders: Secondary | ICD-10-CM | POA: Diagnosis not present

## 2022-08-11 DIAGNOSIS — Z0001 Encounter for general adult medical examination with abnormal findings: Secondary | ICD-10-CM

## 2022-08-11 DIAGNOSIS — F840 Autistic disorder: Secondary | ICD-10-CM | POA: Diagnosis not present

## 2022-08-11 DIAGNOSIS — H6522 Chronic serous otitis media, left ear: Secondary | ICD-10-CM | POA: Diagnosis not present

## 2022-08-11 MED ORDER — NEOMYCIN-POLYMYXIN-HC 3.5-10000-1 OT SUSP: 4.0000 [drp] | Freq: Four times a day (QID) | OTIC | 0 refills | Status: DC

## 2022-08-11 NOTE — Patient Instructions (Signed)
Please apply Cortisporin ear drop as prescribed for dry ears.  Please continue taking Wellbutrin as prescribed.

## 2022-08-11 NOTE — Assessment & Plan Note (Signed)
Physical exam as documented. Has had flu vaccine. No blood tests today as his last blood tests were overall wnl.

## 2022-08-11 NOTE — Progress Notes (Signed)
Established Patient Office Visit  Subjective:  Patient ID: Joshua Gilmore, male    DOB: [birth year]  Age: 22 y.o. MRN: [id number]  CC:  Chief Complaint  Patient presents with   Annual Exam    Patient would like to have his ears checked.    HPI Joshua Gilmore is a 22 y.o. male with past medical history of autism spectrum disorder and alopecia who presents for annual physical.  He has been followed by Psychiatrist.  He has a history of autism spectrum disorder.  He also has adjustment disorder with mixed anxiety and depression, and is on Wellbutrin currently.  He currently works in Art therapist.  He also complains of right > left ear pain and discomfort.  He has seen [facility] specialist for it and was told of dry skin in ears. Denies any hearing loss, tinnitus or balance problem.  Denies any ear discharge.  Denies any fever, chills, nausea, vomiting, nasal congestion or sore throat.  Past Medical History:  Diagnosis Date   ADHD (attention deficit hyperactivity disorder)    Allergic rhinitis    Autism spectrum disorder    Generalized anxiety disorder 05/07/2013    History reviewed. No pertinent surgical history.  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  Tobacco Use   Smoking status: Never   Smokeless tobacco: Never  Vaping Use   Vaping Use: Never used  Substance and Sexual Activity   Alcohol use: Never   Drug use: Never   Sexual activity: Not Currently  Other Topics Concern   Not on file  Social History Narrative    Lives with mom,sister away at college   Social Determinants of Health   Financial Resource Strain: Low Risk  (04/23/2020)   Overall Financial Resource Strain (CARDIA)    Difficulty of Paying Living Expenses: Not hard at all  Food Insecurity: No Food Insecurity (04/23/2020)   Hunger Vital Sign    Worried About Running Out of Food in the Last Year: Never true    Ran Out of Food in the Last Year: Never true  Transportation Needs: No Transportation Needs (04/23/2020)   PRAPARE - Administrator, Civil Service (Medical): No    Lack of Transportation (Non-Medical): No  Physical Activity: Insufficiently Active (04/23/2020)   Exercise Vital Sign    Days of Exercise per Week: 3 days    Minutes of Exercise per Session: 20 min  Stress: No Stress Concern Present (04/23/2020)   [employer] of Occupational Health - Occupational Stress Questionnaire    Feeling of Stress : Not at all  Social Connections: Socially Isolated (04/23/2020)   Social Connection and Isolation Panel [NHANES]    Frequency of Communication with Friends and Family: More than three times a week    Frequency of Social Gatherings with Friends and Family: More than three times a week    Attends Religious Services: Never    Database administrator or Organizations: No    Attends  Club or Organization Meetings: Never    Marital Status: Never married  Intimate Partner Violence: Not At Risk (04/23/2020)   Humiliation, Afraid, Rape, and Kick questionnaire    Fear of Current or Ex-Partner: No    Emotionally Abused: No    Physically Abused: No    Sexually Abused: No    Outpatient Medications Prior to Visit  Medication Sig Dispense Refill   buPROPion (WELLBUTRIN XL) 150 MG 24 hr tablet Take 1 tablet (150 mg total) by mouth daily. 30 tablet 2   phenol (CHLORASEPTIC) 1.4 % LIQD Use as directed 1 spray in the mouth or throat as needed for throat irritation / pain. (Patient not taking: Reported on 05/13/2022) 177 mL 0   No  facility-administered medications prior to visit.    No Known Allergies  ROS Review of Systems  Constitutional:  Negative for chills and fever.  HENT:  Positive for ear pain. Negative for congestion, ear discharge, sinus pressure and sinus pain.   Respiratory:  Negative for cough and shortness of breath.   Cardiovascular:  Negative for chest pain and palpitations.  Gastrointestinal:  Negative for constipation, diarrhea and vomiting.  Genitourinary:  Negative for dysuria and hematuria.  Musculoskeletal:  Negative for neck pain and neck stiffness.  Skin:  Negative for rash.  Neurological:  Negative for dizziness and weakness.  Psychiatric/Behavioral:  Negative for dysphoric mood. The patient is not nervous/anxious.       Objective:    Physical Exam Vitals reviewed.  Constitutional:      General: He is not in acute distress.    Appearance: He is not diaphoretic.  HENT:     Head: Normocephalic and atraumatic.     Right Ear: External ear normal. There is impacted cerumen.     Ears:     Comments: Left ear canal mildly erythematous No tenderness over tragal area    Nose: Nose normal.     Mouth/Throat:     Mouth: Mucous membranes are moist.  Eyes:     General: No scleral icterus.    Extraocular Movements: Extraocular movements intact.     Pupils: Pupils are equal, round, and reactive to light.  Cardiovascular:     Rate and Rhythm: Normal rate and regular rhythm.     Heart sounds: Normal heart sounds. No murmur heard. Abdominal:     Palpations: Abdomen is soft.  Musculoskeletal:     Cervical back: Neck supple. No tenderness.  Skin:    General: Skin is warm.     Findings: No rash.  Neurological:     General: No focal deficit present.     Mental Status: He is alert and oriented to person, place, and time.     Cranial Nerves: No cranial nerve deficit.     Sensory: No sensory deficit.     Motor: No weakness.  Psychiatric:        Mood and Affect: Mood normal.         Speech: Speech is delayed.        Behavior: Behavior is slowed.     BP 130/78 (BP Location: Right Arm, Patient Position: Sitting, Cuff Size: Normal)   Pulse 69   Ht 6' (1.829 m)   Wt 149 lb (67.6 kg)   SpO2 98%   BMI 20.21 kg/m  Wt Readings from Last 3 Encounters:  08/11/22 149 lb (67.6 kg)  05/13/22 144 lb 12.8 oz (65.7 kg)  02/15/22 141 lb 12.8 oz (64.3 kg)    Lab Results  Component Value Date   TSH 0.865 07/29/2021   Lab Results  Component Value Date   WBC 3.4 07/29/2021   HGB 15.2 07/29/2021   HCT 47.0 07/29/2021   MCV 82 07/29/2021   PLT 312 07/29/2021   Lab Results  Component Value Date   NA 140 07/29/2021   K 4.3 07/29/2021   CO2 25 07/29/2021   GLUCOSE 80 07/29/2021   BUN 12 07/29/2021   CREATININE 1.05 07/29/2021   BILITOT 2.1 (H) 07/29/2021   ALKPHOS 72 07/29/2021   AST 20 07/29/2021   ALT 14 07/29/2021   PROT 7.8 07/29/2021   ALBUMIN 5.4 (H) 07/29/2021   CALCIUM 10.2 07/29/2021   EGFR 104 07/29/2021   Lab Results  Component Value Date   CHOL 160 05/02/2020   Lab Results  Component Value Date   HDL 62 05/02/2020   Lab Results  Component Value Date   LDLCALC 87 05/02/2020   Lab Results  Component Value Date   TRIG 52 05/02/2020   Lab Results  Component Value Date   CHOLHDL 2.6 05/02/2020   No results found for: "HGBA1C"    Assessment & Plan:   Problem List Items Addressed This Visit    Encounter for general adult medical examination with abnormal findings Physical exam as documented. Has had flu vaccine. No blood tests today as his last blood tests were overall wnl.  Autism spectrum disorder Followed by psychiatry - Dr. Modesta Messing Mother wants personality test done, needs to discuss with her Psychiatrist Needs to have [facility] therapy  Left chronic serous otitis media Had referred to ENT for persistent symptoms Has been treated with otic Ofloxacin drops for otitis externa in the past, ear exam showed impacted ear wax on right ear -  ear irrigation done Cortisporin ear drops for chronic irritation    Meds ordered this encounter  Medications   neomycin-polymyxin-hydrocortisone (CORTISPORIN) 3.5-10000-1 OTIC suspension    Sig: Place 4 drops into both ears 4 (four) times daily.    Dispense:  10 mL    Refill:  0    Follow-up: Return in about 1 year (around 08/12/2023) for Annual physical.    Lindell Spar, MD

## 2022-08-11 NOTE — Assessment & Plan Note (Signed)
Followed by psychiatry - Dr. Modesta Messing Mother wants personality test done, needs to discuss with her Psychiatrist Needs to have [facility] therapy

## 2022-08-11 NOTE — Assessment & Plan Note (Signed)
Had referred to ENT for persistent symptoms Has been treated with otic Ofloxacin drops for otitis externa in the past, ear exam showed impacted ear wax on right ear - ear irrigation done Cortisporin ear drops for chronic irritation

## 2022-08-11 NOTE — Assessment & Plan Note (Signed)
Well controlled with Wellbutrin Followed by psychiatry - Dr. Modesta Messing

## 2022-08-27 ENCOUNTER — Other Ambulatory Visit: Payer: Self-pay | Admitting: Psychiatry

## 2022-08-27 DIAGNOSIS — F4323 Adjustment disorder with mixed anxiety and depressed mood: Secondary | ICD-10-CM

## 2022-09-11 ENCOUNTER — Ambulatory Visit (HOSPITAL_COMMUNITY)
Admission: EM | Admit: 2022-09-11 | Discharge: 2022-09-11 | Disposition: A | Discharge status: Home / Self Care | Payer: 59 | Attending: Family | Admitting: Family

## 2022-09-11 ENCOUNTER — Encounter (HOSPITAL_COMMUNITY): Payer: Self-pay | Admitting: Psychiatry

## 2022-09-11 DIAGNOSIS — G479 Sleep disorder, unspecified: Secondary | ICD-10-CM | POA: Diagnosis not present

## 2022-09-11 MED ORDER — HYDROXYZINE HCL 25 MG PO TABS: 25.0000 mg | ORAL_TABLET | Freq: Every evening | ORAL | 0 refills | Status: DC | PRN

## 2022-09-11 NOTE — Discharge Instructions (Signed)

## 2022-09-11 NOTE — ED Triage Notes (Signed)
Pt presents to [facility] accompanied by his mother due to insomnia. Pt reports being unable to sleep throughtout the night for the past 3 days. Pts mother reports inconsistency in his medication regimen. Pt denies SI/HI and AVH.

## 2022-09-11 NOTE — ED Notes (Signed)
Patient was discharged by provider. Patient was given community resources with his AVS.

## 2022-09-11 NOTE — ED Provider Notes (Signed)
Behavioral Health Urgent Care Medical Screening Exam  Patient Name: Joshua Gilmore MRN: [id number] Date of Evaluation: 09/11/22 Chief Complaint:   Diagnosis:  Final diagnoses:  Sleeping difficulty    History of Present illness: Joshua Gilmore is a 22 y.o. male.  Presents to [facility] urgent care accompanied by his mother reporting " sleeping issues for the past 3 days."  He denied suicidal or homicidal ideations.  Denies auditory visual hallucinations.  Patient did not identify any triggers that happened over the past few days. Patient did report experiencing "bad dreams."   Patient is currently followed  [facility] for medication management.  Mother reports patient is currently prescribed Wellbutrin however, has been inconsistent with taking medications.   He denied illicit drug use or substance abuse history.  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  (Denied that he schedule for shift work).  He has a charted history with autism disorder, attention deficit disorder, generalized anxiety disorder and major depressive disorder. Denied that patient as tried any OTC medication to assist with symptoms.   During evaluation Joshua Gilmore is sitting in no acute distress. he is alert/oriented x 4; calm/cooperative; and mood congruent with affect. he is speaking in a clear tone at moderate volume, and normal pace; with good eye contact. his thought process is coherent and relevant; There is no indication that he is currently responding to internal/external stimuli or experiencing delusional thought content; and he has denied suicidal/self-harm/homicidal ideation, psychosis, and paranoia.   Patient has remained calm throughout assessment and has answered questions appropriately.    At this time Joshua Gilmore is educated and verbalizes understanding of mental health resources and other crisis services in the community. he is instructed to call 911 and  present to the nearest emergency room should he experience any suicidal/homicidal ideation, auditory/visual/hallucinations, or detrimental worsening of his mental health condition. his was a also advised by Probation officer that he could call the toll-free phone on insurance card to assist with identifying in network counselors and agencies or number on back of Medicaid card t speak with care coordinator   [place] ED from 09/11/2022 in [facility] Office Visit from 05/13/2022 in [place] Office Visit from 02/15/2022 in [place] No Risk No Risk No Risk       Psychiatric Specialty Exam  Presentation  General Appearance:Appropriate for Environment  Eye Contact:Good  Speech:Clear and Coherent  Speech Volume:Normal  Handedness:Right   Mood and Affect  Mood:Anxious  Affect:Congruent   Thought Process  Thought Processes:Coherent  Descriptions of Associations:Intact  Orientation:Full (Time, Place and Person)  Thought Content:Logical    Hallucinations:None  Ideas of Reference:None  Suicidal Thoughts:No  Homicidal Thoughts:No   Sensorium  Memory:Remote Good; Immediate Good; Remote Fair  Judgment:Fair  Insight:Good   Executive Functions  Concentration:Good  Attention Span:Good  Joshua Gilmore of Knowledge:Good  Language:Good   Psychomotor Activity  Psychomotor Activity:Normal   Assets  Assets:Desire for Improvement; Social Support   Sleep  Sleep:Fair  Number of hours: No data recorded  Physical Exam: Physical Exam Vitals and nursing note reviewed.  Constitutional:      Appearance: Normal appearance.  Cardiovascular:     Rate and Rhythm: Normal rate and regular rhythm.     Pulses: Normal pulses.     Heart sounds: Normal heart sounds.  Neurological:     General: No focal deficit  present.     Mental Status:  He is alert and oriented to person, place, and time.  Psychiatric:        Mood and Affect: Mood normal.        Behavior: Behavior normal.    Review of Systems  Respiratory: Negative.    Cardiovascular: Negative.   Psychiatric/Behavioral:  Positive for depression. The patient has insomnia.   All other systems reviewed and are negative.  Blood pressure 131/86, pulse 80, temperature 97.7 F (36.5 C), temperature source Oral, resp. rate 18, SpO2 100 %. There is no height or weight on file to calculate BMI.  Musculoskeletal: Strength & Muscle Tone: within normal limits Gait & Station: normal Patient leans: N/A   [place] [facility] Discharge Disposition for Follow up and Recommendations: Based on my evaluation the patient does not appear to have an emergency medical condition and can be discharged with resources and follow up care in outpatient services for Medication Management and Individual Therapy   Derrill [place], NP 09/11/2022, 12:45 PM

## 2022-10-09 NOTE — Progress Notes (Deleted)
[place] MD/PA/NP OP Progress Note  10/09/2022 9:02 AM Joshua Gilmore  MRN:  [id number]  Chief Complaint: No chief complaint on file.  HPI:  - he went to [facility], acccompanied by his mother due to insomnia and bad reams for three days. He was discharged with the plan to follow up with outpatient psych. Noted that he is reportedly non adherent to bupropion.      Mother wants personality test done   Visit Diagnosis: No diagnosis found.  Past Psychiatric History: Please see initial evaluation for full details. I have reviewed the history. No updates at this time.     Past Medical History:  Past Medical History:  Diagnosis Date   ADHD (attention deficit hyperactivity disorder)    Allergic rhinitis    Autism spectrum disorder    Generalized anxiety disorder 05/07/2013   No past surgical history on file.  Family Psychiatric History: Please see initial evaluation for full details. I have reviewed the history. No updates at this time.     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  Tobacco Use   Smoking status: Never   Smokeless tobacco: Never  Vaping Use   Vaping Use: Never used  Substance and Sexual Activity   Alcohol use: Never   Drug use: Never   Sexual activity: Not Currently  Other Topics Concern   Not on file  Social History Narrative   Lives with mom,sister away at college   Social Determinants of Health   Financial Resource Strain: [place]  (04/23/2020)   Overall Financial Resource Strain (CARDIA)     Difficulty of Paying Living Expenses: Not hard at all  Food Insecurity: No Food Insecurity (04/23/2020)   Hunger Vital Sign    Worried About Running Out of Food in the Last Year: Never true    [place] in the Last Year: Never true  Transportation Needs: No Transportation Needs (04/23/2020)   PRAPARE - Hydrologist (Medical): No    Lack of Transportation (Non-Medical): No  Physical Activity: Insufficiently Active (04/23/2020)   Exercise Vital Sign    Days of Exercise per Week: 3 days    Minutes of Exercise per Session: 20 min  Stress: No Stress Concern Present (04/23/2020)   [place]    Feeling of Stress : Not at all  Social Connections: Socially Isolated (04/23/2020)   Social Connection and Isolation Panel [NHANES]    Frequency of Communication with Friends and Family: More than three times a week    Frequency of Social Gatherings with Friends and Family: More than three times a week    Attends Religious Services: Never    Marine scientist or Organizations: No    Attends Archivist Meetings: Never    Marital Status: Never married    Allergies: No Known Allergies  Metabolic Disorder Labs: No results found for: "HGBA1C", "  MPG" No results found for: "PROLACTIN" Lab Results  Component Value Date   CHOL 160 05/02/2020   TRIG 52 05/02/2020   HDL 62 05/02/2020   CHOLHDL 2.6 05/02/2020   LDLCALC 87 05/02/2020   Lab Results  Component Value Date   TSH 0.865 07/29/2021   TSH 1.100 05/02/2020    Therapeutic Level Labs: No results found for: "LITHIUM" No results found for: "VALPROATE" No results found for: "CBMZ"  Current Medications: Current Outpatient Medications  Medication Sig Dispense Refill   buPROPion (WELLBUTRIN XL) 150 MG 24 hr tablet Take 1 tablet (150 mg total) by mouth daily. 30 tablet 1   hydrOXYzine (ATARAX) 25 MG tablet Take 1 tablet (25 mg  total) by mouth at bedtime as needed. 10 tablet 0   neomycin-polymyxin-hydrocortisone (CORTISPORIN) 3.5-10000-1 OTIC suspension Place 4 drops into both ears 4 (four) times daily. 10 mL 0   No current facility-administered medications for this visit.     Musculoskeletal: Strength & Muscle Tone: within normal limits Gait & Station: normal Patient leans: N/A  Psychiatric Specialty Exam: Review of Systems  There were no vitals taken for this visit.There is no height or weight on file to calculate BMI.  General Appearance: {Appearance:22683}  Eye Contact:  {BHH EYE CONTACT:22684}  Speech:  Clear and Coherent  Volume:  Normal  Mood:  {BHH MOOD:22306}  Affect:  {Affect (PAA):22687}  Thought Process:  Coherent  Orientation:  Full (Time, Place, and Person)  Thought Content: Logical   Suicidal Thoughts:  {ST/HT (PAA):22692}  Homicidal Thoughts:  {ST/HT (PAA):22692}  Memory:  Immediate;   Good  Judgement:  {Judgement (PAA):22694}  Insight:  {Insight (PAA):22695}  Psychomotor Activity:  Normal  Concentration:  Concentration: Good and Attention Span: Good  Recall:  Good  Fund of Knowledge: Good  Language: Good  Akathisia:  No  Handed:  Right  AIMS (if indicated): not done  Assets:  Communication Skills Desire for Improvement  ADL's:  Intact  Cognition: WNL  Sleep:  {BHH GOOD/FAIR/POOR:22877}   Screenings: GAD-7    Flowsheet Row Office Visit from 05/13/2022 in [place] Office Visit from 02/15/2022 in [place]  Total GAD-7 Score 2 0      PHQ2-9    [place] Office Visit from 08/11/2022 in [facility] Office Visit from 05/13/2022 in [place] Office Visit from 02/15/2022 in [place] Office Visit from 01/27/2022 in [facility] Office Visit from 12/21/2021 in [place]  PHQ-2 Total Score 1 1 1  0 3  PHQ-9 Total Score -- -- 4 -- 3      Flowsheet Row ED from 09/11/2022 in [facility] Office Visit from 05/13/2022 in [place] Office Visit from 02/15/2022 in [place] No Risk No Risk No Risk        Assessment and Plan:  Joshua Gilmore is a 22 y.o. year old male with a history of autism, ADHD, mild intellectual developmental disorder, who presents for follow up appointment for below.    1. Adjustment disorder with mixed anxiety and depressed mood He denies significant mood symptoms despite recent stress at work.  Other psychosocial stressors includes loneliness.  He is not adherent to medication; he agrees to restart bupropion to target depression. He will greatly benefit from CBT.  Referral was made, and he still does not have an appointment.  He was given [place] office number to schedule for the appointment.    2. Autism spectrum disorder 3. Intellectual developmental disorder, mild He demonstrated good insight into his childhood, and understanding of his current environment.  Although he used to report interest in filming, he is now interested in becoming a doctor.  During visits since initial evaluation, he has not displayed symptoms to be concerned of psychotic disorder except that he had some tangential thought process on initial visit, which be more attributable to anxiety.  No safety concern of aggression reported by his caregiver.      Plan Restart bupropion 150 mg daily  Referral to therapy to [place] office Next appointment: 1/22 at 11:30  for 30 mins, in person   The patient demonstrates the following risk factors for suicide: Chronic risk factors for suicide include: psychiatric disorder of autism, ADHD . Acute risk factors for suicide include: social  withdrawal/isolation. Protective factors for this patient include: positive social support. Considering these factors, the overall suicide risk at this point appears to be low. Patient is appropriate for outpatient follow up.     Collaboration of Care: Collaboration of Care: {BH OP Collaboration of Care:21014065}  Patient/Guardian was advised Release of Information must be obtained prior to any record release in order to collaborate their care with an outside provider. Patient/Guardian was advised if they have not already done so to contact the registration department to sign all necessary forms in order for [place] to release information regarding their care.   Consent: Patient/Guardian gives verbal consent for treatment and assignment of benefits for services provided during this visit. Patient/Guardian expressed understanding and agreed to proceed.    Norman Clay, MD 10/09/2022, 9:03 AM

## 2022-10-11 ENCOUNTER — Ambulatory Visit: Payer: 59 | Admitting: Psychiatry

## 2022-10-13 NOTE — Progress Notes (Signed)
Virtual Visit via Video Note  I connected with Joshua Gilmore on 10/14/22 at  2:30 PM EDT by a video enabled telemedicine application and verified that I am speaking with the correct person using two identifiers.  Location: Patient: home Provider: office Persons participated in the visit- patient, provider    I discussed the limitations of evaluation and management by telemedicine and the availability of in person appointments. The patient expressed understanding and agreed to proceed.    I discussed the assessment and treatment plan with the patient. The patient was provided an opportunity to ask questions and all were answered. The patient agreed with the plan and demonstrated an understanding of the instructions.   The patient was advised to call back or seek an in-person evaluation if the symptoms worsen or if the condition fails to improve as anticipated.  I provided 25 minutes of non-face-to-face time during this encounter.   Norman Clay, MD    [facility] MD/PA/NP OP Progress Note  10/14/2022 3:06 PM Joshua Gilmore  MRN:  [id number]  Chief Complaint:  Chief Complaint  Patient presents with   Follow-up   HPI:  - he went to [facility], accompanied by his mother due to insomnia and bad reams for three days. He was discharged with the plan to follow up with outpatient psych. Noted that he is reportedly non adherent to bupropion.   This is a follow-up appointment for adjustment disorder with depressed mood.  He states that he has issues with insomnia.  He sleeps at 11 PM, and wakes up around 5 AM to go to work.  He plays video games, watching TV, YouTube at night.  When discussing the sleep hygiene, he states that he would like to see a therapist.  Provided psychoeducation about sleep hygiene, he states that "yeah I can (when he is asked if he can refrain from watching TV at night)."  Although he signed up to gym, he does not have transportation.  He does not want to walk around the house  as he does not feel safe around the stranger. He states that he sees a car, which goes up and down. He believes the stalker is up there, and this makes him feel sensitive.  He also states that he tends to feel freaked out when the package is delivered as he is suspicious that some person might break in.  He also tends to feel anxious when the boss comes into the room.  He denies change in appetite.  He feels down at times.  He denies SI.  He denies panic attacks. He denies AH, VH.  He denies alcohol use or drug use.  He admits not taking medication consistently. He forgets and takes it a few times per week or less. He wants to see a therapist for his anxiety and insomnia. (He is notified to make sure to contact the clinic if he receives a call from [facility], considering referral was sent at least a few times in the past).   Discussed with his mother after the visit with the patient consent.  She states that he has been doing fine.  He talks to himself at times or talks to his cat.  He went to urgent care due to insomnia.  However, she is also aware that he plays video games at night.  He used to play outside before COVID.  She is not aware of any paranoia or hallucinations.  She denies any past history of schizophrenia.  She denies any FD concern  to himself or others.  She is not aware of any alcohol or drug use. She agrees with the plan as below.  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 Adjustment disorder with mixed anxiety and depressed mood  F43.23     2. Insomnia, unspecified type  G47.00     3. Autism spectrum disorder  F84.0       Past Psychiatric History: Please see initial evaluation for full details. I have reviewed the history. No updates at this time.     Past  Medical History:  Past Medical History:  Diagnosis Date   ADHD (attention deficit hyperactivity disorder)    Allergic rhinitis    Autism spectrum disorder    Generalized anxiety disorder 05/07/2013   No past surgical history on file.  Family Psychiatric History: Please see initial evaluation for full details. I have reviewed the history. No updates at this time.     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  Tobacco Use   Smoking status: Never   Smokeless tobacco: Never  Vaping Use   Vaping Use: Never used  Substance and Sexual Activity   Alcohol use: Never   Drug use: Never   Sexual activity: Not Currently  Other Topics Concern   Not on file  Social History Narrative   Lives with mom,sister away at college   Social Determinants of Health   Financial Resource Strain: [place]  (04/23/2020)   Overall Financial Resource Strain (CARDIA)    Difficulty of Paying Living Expenses: Not hard at all  Food Insecurity: No Food Insecurity (04/23/2020)   Hunger Vital Sign    Worried About Running Out of Food in the Last Year: Never true    [place] in the Last Year: Never true  Transportation Needs: No Transportation Needs (04/23/2020)   PRAPARE - Hydrologist (Medical): No    Lack of Transportation (Non-Medical): No  Physical Activity: Insufficiently Active (04/23/2020)   Exercise Vital Sign    Days of Exercise per Week: 3 days    Minutes of Exercise per Session: 20 min  Stress:  No Stress Concern Present (04/23/2020)   [place]    Feeling of Stress : Not at all  Social Connections: Socially Isolated (04/23/2020)   Social Connection and Isolation Panel [NHANES]    Frequency of Communication with Friends and Family: More than three times a week    Frequency of Social Gatherings with Friends and Family: More than three times a week    Attends Religious Services: Never    Marine scientist or Organizations: No    Attends Archivist Meetings: Never    Marital Status:  Never married    Allergies: No Known Allergies  Metabolic Disorder Labs: No results found for: "HGBA1C", "MPG" No results found for: "PROLACTIN" Lab Results  Component Value Date   CHOL 160 05/02/2020   TRIG 52 05/02/2020   HDL 62 05/02/2020   CHOLHDL 2.6 05/02/2020   LDLCALC 87 05/02/2020   Lab Results  Component Value Date   TSH 0.865 07/29/2021   TSH 1.100 05/02/2020    Therapeutic Level Labs: No results found for: "LITHIUM" No results found for: "VALPROATE" No results found for: "CBMZ"  Current Medications: Current Outpatient Medications  Medication Sig Dispense Refill   FLUoxetine (PROZAC) 20 MG capsule Take 1 capsule (20 mg total) by mouth daily. 30 capsule 1   neomycin-polymyxin-hydrocortisone (CORTISPORIN) 3.5-10000-1 OTIC suspension Place 4 drops into both ears 4 (four) times daily. 10 mL 0   No current facility-administered medications for this visit.     Musculoskeletal: Strength & Muscle Tone: within normal limits Gait & Station: normal Patient leans: N/A  Psychiatric Specialty Exam: Review of Systems  Psychiatric/Behavioral:  Positive for dysphoric mood and sleep disturbance. Negative for agitation, behavioral problems, confusion, decreased concentration, hallucinations, self-injury and suicidal ideas. The patient is nervous/anxious. The patient is not hyperactive.   All other systems  reviewed and are negative.   There were no vitals taken for this visit.There is no height or weight on file to calculate BMI.  General Appearance: Fairly Groomed  Eye Contact:  Good  Speech:  Clear and Coherent  Volume:  Normal  Mood:   anxious  Affect:  Appropriate, Non-Congruent, and Restricted  Thought Process:  Coherent  Orientation:  Full (Time, Place, and Person)  Thought Content: Logical   Suicidal Thoughts:  No  Homicidal Thoughts:  No  Memory:  Immediate;   Good  Judgement:  Good  Insight:  Present  Psychomotor Activity:  Normal  Concentration:  Concentration: Good and Attention Span: Good  Recall:  Good  Fund of Knowledge: Good  Language: Good  Akathisia:  No  Handed:  Right  AIMS (if indicated): not done  Assets:  Communication Skills Desire for Improvement  ADL's:  Intact  Cognition: WNL  Sleep:  Poor   Screenings: GAD-7    Flowsheet Row Office Visit from 05/13/2022 in [facility] [employer] Office Visit from 02/15/2022 in [place]  Total GAD-7 Score 2 0      PHQ2-9    [place] Office Visit from 08/11/2022 in [facility] Office Visit from 05/13/2022 in [place] Office Visit from 02/15/2022 in [place] Office Visit from 01/27/2022 in [facility] Office Visit from 12/21/2021 in [place]  PHQ-2 Total Score 1 1 1  0 3  PHQ-9 Total Score -- -- 4 -- 3      Flowsheet Row ED from 09/11/2022 in [facility] Office Visit from 05/13/2022 in [place] Office Visit from 02/15/2022 in [place] No Risk No Risk No Risk        Assessment and Plan:  Joshua Gilmore is a 22 y.o. year old  male with a history of autism, ADHD, mild intellectual developmental disorder, who presents for follow up appointment for below.   1. Adjustment disorder with mixed anxiety and depressed mood Acute stressors include: work related stress  Other  stressors include: loneliness   History: non adherent to medication  Slight worsening in anxiety in the setting of stressors as above.  Given he is no adherence, will switch fluoxetine to target depressed mood and anxiety considering its long half life.   2. Insomnia, unspecified type Coached sleep hygiene, which includes limiting playing video games, doing regular exercise.  He denies any snoring.  Will first work on this sleep hygiene before starting hypnotics.   3. Autism spectrum disorder 3. Intellectual developmental disorder, mild He demonstrated good insight into his childhood, and understanding of his current environment.  Although he used to report interest in filming, he is now interested in becoming a doctor.  During visits since initial evaluation, he has not displayed symptoms to be concerned of psychotic disorder except that he has paranoia/anxiety and some tangential thought process on initial visit, which be more attributable to anxiety. No known history of schizophrenia, and no safety concern of aggression reported by his caregiver.      Plan Discontinue bupropion (was on 150 mg, not adherent) Start fluoxetine 20 mg daily  Referral to therapy to [facility] office- message was sent Next appointment: 5/14 at 4 pm for 30 mins, in person   The patient demonstrates the following risk factors for suicide: Chronic risk factors for suicide include: psychiatric disorder of autism, ADHD . Acute risk factors for suicide include: social withdrawal/isolation. Protective factors for this patient include: positive social support. Considering these factors, the overall suicide risk at this point appears to be low. Patient is appropriate for outpatient  follow up.       Collaboration of Care: Collaboration of Care: Other reviewed notes in Epic  Patient/Guardian was advised Release of Information must be obtained prior to any record release in order to collaborate their care with an outside provider. Patient/Guardian was advised if they have not already done so to contact the registration department to sign all necessary forms in order for [place] to release information regarding their care.   Consent: Patient/Guardian gives verbal consent for treatment and assignment of benefits for services provided during this visit. Patient/Guardian expressed understanding and agreed to proceed.    Norman Clay, MD 10/14/2022, 3:06 PM

## 2022-10-14 ENCOUNTER — Telehealth (INDEPENDENT_AMBULATORY_CARE_PROVIDER_SITE_OTHER): Payer: 59 | Admitting: Psychiatry

## 2022-10-14 ENCOUNTER — Encounter: Payer: Self-pay | Admitting: Psychiatry

## 2022-10-14 DIAGNOSIS — G4700 Insomnia, unspecified: Secondary | ICD-10-CM | POA: Diagnosis not present

## 2022-10-14 DIAGNOSIS — F840 Autistic disorder: Secondary | ICD-10-CM

## 2022-10-14 DIAGNOSIS — F4323 Adjustment disorder with mixed anxiety and depressed mood: Secondary | ICD-10-CM | POA: Diagnosis not present

## 2022-10-14 MED ORDER — FLUOXETINE HCL 20 MG PO CAPS: 20.0000 mg | ORAL_CAPSULE | Freq: Every day | ORAL | 1 refills | Status: DC

## 2022-10-18 ENCOUNTER — Ambulatory Visit: Payer: Self-pay | Admitting: Internal Medicine

## 2022-10-20 ENCOUNTER — Ambulatory Visit: Payer: Managed Care, Other (non HMO) | Admitting: Internal Medicine

## 2022-10-20 ENCOUNTER — Encounter: Payer: Self-pay | Admitting: Internal Medicine

## 2022-10-20 VITALS — BP 117/69 | HR 82 | Ht 72.0 in | Wt 144.2 lb

## 2022-10-20 DIAGNOSIS — M545 Low back pain, unspecified: Secondary | ICD-10-CM | POA: Insufficient documentation

## 2022-10-20 DIAGNOSIS — M25561 Pain in right knee: Secondary | ICD-10-CM

## 2022-10-20 MED ORDER — IBUPROFEN 800 MG PO TABS: 800.0000 mg | ORAL_TABLET | Freq: Three times a day (TID) | ORAL | 0 refills | Status: DC | PRN

## 2022-10-20 NOTE — Patient Instructions (Addendum)
Please take Ibuprofen as needed for knee pain.  Please apply ice and perform leg elevation as tolerated.

## 2022-10-20 NOTE — Assessment & Plan Note (Signed)
No swelling or erythema Likely mild twisting injury Ibuprofen as needed Ice application advised Rest and leg elevation

## 2022-10-20 NOTE — Assessment & Plan Note (Signed)
Likely due to heavy lifting and frequent bending Needs to have proper training at work to avoid strain on the back while lifting weights Simple back exercises material provided Ibuprofen as needed for pain

## 2022-10-20 NOTE — Progress Notes (Signed)
Acute Office Visit  Subjective:    Patient ID: Joshua Gilmore, male    DOB: 27-Apr-2001, 22 y.o.   MRN: [id number]  Chief Complaint  Patient presents with   Knee Pain    Patient states he has been having right knee pain for a week now    HPI Patient is in today for complaint of acute onset right knee pain since 10/13/22.  She had a twisting movement of the right knee in his bed, and heard mild popping sound.  She denies any swelling of the right knee.  Denies any impact injury of the right knee.  He has not tried ibuprofen with some relief.  Denies any numbness or tingling of the LE.  He also reports acute on chronic low back pain for the last 2 days, especially after lifting heavy boxes at his workplace.  He has to bend frequently to clean surfaces at workplace as well.  He denies any injury or falls.  Past Medical History:  Diagnosis Date   ADHD (attention deficit hyperactivity disorder)    Allergic rhinitis    Autism spectrum disorder    Generalized anxiety disorder 05/07/2013    History reviewed. No pertinent surgical history.  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  Tobacco Use   Smoking status: Never   Smokeless tobacco: Never  Vaping Use   Vaping Use: Never used  Substance and Sexual Activity   Alcohol use: Never   Drug use: Never   Sexual activity: Not Currently  Other Topics Concern   Not on file  Social History Narrative   Lives with mom,sister away at college   Social Determinants of Health   Financial Resource Strain: [place]  (10/18/2022)   Overall Financial Resource Strain (CARDIA)    Difficulty of Paying Living Expenses: Not hard at all  Food Insecurity: No Food Insecurity (10/18/2022)   Hunger Vital Sign    Worried About Running Out of Food in the Last Year: Never true    [place] in the Last Year: Never true  Transportation Needs: No Transportation Needs (10/18/2022)   PRAPARE - Hydrologist (Medical): No    Lack of Transportation (Non-Medical): No  Physical Activity: Unknown (10/18/2022)   Exercise Vital Sign    Days of Exercise per Week: 0 days    Minutes of Exercise per Session: Not on file  Stress: Stress Concern Present (10/18/2022)   [place]    Feeling of Stress : To some extent  Social Connections: Unknown (10/18/2022)   Social Connection and Isolation Panel [NHANES]    Frequency of Communication with Friends and Family: More than three times a week    Frequency of Social Gatherings with Friends and Family: Once a week    Attends Religious Services: 1 to 4 times per year    Active Member of [employer] or Organizations: No    Attends [employer]  or Organization Meetings: Not on file    Marital Status: Patient declined  Intimate Partner Violence: Not At Risk (04/23/2020)   Humiliation, Afraid, Rape, and Kick questionnaire    Fear of Current or Ex-Partner: No    Emotionally Abused: No    Physically Abused: No    Sexually Abused: No    Outpatient Medications Prior to Visit  Medication Sig Dispense Refill   FLUoxetine (PROZAC) 20 MG capsule Take 1 capsule (20 mg total) by mouth daily. 30 capsule 1   neomycin-polymyxin-hydrocortisone (CORTISPORIN) 3.5-10000-1 OTIC suspension Place 4 drops into both ears 4 (four) times daily. (Patient not taking: Reported on 10/20/2022) 10 mL 0   No facility-administered medications prior to visit.    No Known Allergies  Review of Systems  Constitutional:  Negative for  chills and fever.  HENT:  Negative for congestion, ear discharge, sinus pressure and sinus pain.   Respiratory:  Negative for cough and shortness of breath.   Cardiovascular:  Negative for chest pain and palpitations.  Gastrointestinal:  Negative for constipation, diarrhea and vomiting.  Genitourinary:  Negative for dysuria and hematuria.  Musculoskeletal:  Positive for back pain. Negative for neck pain and neck stiffness.       Right knee pain  Skin:  Negative for rash.  Neurological:  Negative for dizziness and weakness.  Psychiatric/Behavioral:  Negative for dysphoric mood. The patient is not nervous/anxious.        Objective:    Physical Exam Vitals reviewed.  Constitutional:      General: He is not in acute distress.    Appearance: He is not diaphoretic.  HENT:     Head: Normocephalic and atraumatic.     Nose: Nose normal.     Mouth/Throat:     Mouth: Mucous membranes are moist.  Eyes:     General: No scleral icterus.    Extraocular Movements: Extraocular movements intact.  Cardiovascular:     Rate and Rhythm: Normal rate and regular rhythm.     Heart sounds: Normal heart sounds. No murmur heard. Pulmonary:     Breath sounds: Normal breath sounds. No wheezing or rales.  Abdominal:     Palpations: Abdomen is soft.  Musculoskeletal:     Cervical back: Neck supple. No tenderness.     Lumbar back: Tenderness present. Normal range of motion. Negative right straight leg raise test and negative left straight leg raise test.     Right knee: No swelling. Normal range of motion. No tenderness.  Skin:    General: Skin is warm.     Findings: No rash.  Neurological:     General: No focal deficit present.     Mental Status: He is alert and oriented to person, place, and time.     Sensory: No sensory deficit.     Motor: No weakness.  Psychiatric:        Mood and Affect: Mood normal.        Speech: Speech is delayed.        Behavior: Behavior is slowed.     BP 117/69 (BP  Location: Left Arm, Patient Position: Sitting, Cuff Size: Normal)   Pulse 82   Ht 6' (1.829 m)   Wt 144 lb 3.2 oz (65.4 kg)   SpO2 97%   BMI 19.56 kg/m  Wt Readings from Last 3 Encounters:  10/20/22 144 lb 3.2 oz (65.4 kg)  08/11/22 149 lb (67.6 kg)  01/27/22 139 lb 12.8 oz (63.4 kg)  Assessment & Plan:   Problem List Items Addressed This Visit       Other   Acute pain of right knee - Primary    No swelling or erythema Likely mild twisting injury Ibuprofen as needed Ice application advised Rest and leg elevation      Relevant Medications   ibuprofen (ADVIL) 800 MG tablet   Acute low back pain without sciatica    Likely due to heavy lifting and frequent bending Needs to have proper training at work to avoid strain on the back while lifting weights Simple back exercises material provided Ibuprofen as needed for pain      Relevant Medications   ibuprofen (ADVIL) 800 MG tablet     Meds ordered this encounter  Medications   ibuprofen (ADVIL) 800 MG tablet    Sig: Take 1 tablet (800 mg total) by mouth every 8 (eight) hours as needed.    Dispense:  30 tablet    Refill:  0     Floree Zuniga Keith Rake, MD

## 2022-11-28 NOTE — Progress Notes (Deleted)
BH MD/PA/NP OP Progress Note  11/28/2022 11:04 AM Joshua Gilmore  MRN:  [id number]  Chief Complaint: No chief complaint on file.  HPI: *** Visit Diagnosis: No diagnosis found.  Past Psychiatric History: Please see initial evaluation for full details. I have reviewed the history. No updates at this time.     Past Medical History:  Past Medical History:  Diagnosis Date   ADHD (attention deficit hyperactivity disorder)    Allergic rhinitis    Autism spectrum disorder    Generalized anxiety disorder 05/07/2013   No past surgical history on file.  Family Psychiatric History: Please see initial evaluation for full details. I have reviewed the history. No updates at this time.     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  Tobacco Use   Smoking status: Never   Smokeless tobacco: Never  Vaping Use   Vaping Use: Never used  Substance and Sexual Activity   Alcohol use: Never   Drug use: Never   Sexual activity: Not Currently  Other Topics Concern   Not on file  Social History Narrative   Lives with mom,sister away at college   Social Determinants of Health   Financial Resource Strain: Low Risk  (10/18/2022)   Overall Financial Resource Strain (CARDIA)    Difficulty of Paying Living Expenses: Not hard at all  Food Insecurity: No Food Insecurity (10/18/2022)   Hunger Vital Sign    Worried About Running Out of Food in the Last Year: Never true    Ran Out of Food in the Last Year: Never true  Transportation Needs: No  Transportation Needs (10/18/2022)   PRAPARE - Administrator, Civil Service (Medical): No    Lack of Transportation (Non-Medical): No  Physical Activity: Unknown (10/18/2022)   Exercise Vital Sign    Days of Exercise per Week: 0 days    Minutes of Exercise per Session: Not on file  Stress: Stress Concern Present (10/18/2022)   [employer] of Occupational Health - Occupational Stress Questionnaire    Feeling of Stress : To some extent  Social Connections: Unknown (10/18/2022)   Social Connection and Isolation Panel [NHANES]    Frequency of Communication with Friends and Family: More than three times a week    Frequency of Social Gatherings with Friends and Family: Once a week    Attends Religious Services: 1 to 4 times per year    Active Member of [employer] or Organizations: No    Attends Engineer, structural: Not on file    Marital Status: Patient declined    Allergies: No Known Allergies  Metabolic Disorder Labs: No results found for: "HGBA1C", "MPG" No results found for: "PROLACTIN" Lab Results  Component Value Date   CHOL 160 05/02/2020   TRIG 52 05/02/2020   HDL 62 05/02/2020   CHOLHDL 2.6 05/02/2020   LDLCALC 87 05/02/2020   Lab Results  Component Value Date   TSH 0.865 07/29/2021  TSH 1.100 05/02/2020    Therapeutic Level Labs: No results found for: "LITHIUM" No results found for: "VALPROATE" No results found for: "CBMZ"  Current Medications: Current Outpatient Medications  Medication Sig Dispense Refill   FLUoxetine (PROZAC) 20 MG capsule Take 1 capsule (20 mg total) by mouth daily. 30 capsule 1   ibuprofen (ADVIL) 800 MG tablet Take 1 tablet (800 mg total) by mouth every 8 (eight) hours as needed. 30 tablet 0   neomycin-polymyxin-hydrocortisone (CORTISPORIN) 3.5-10000-1 OTIC suspension Place 4 drops into both ears 4 (four) times daily. (Patient not taking: Reported on 10/20/2022) 10 mL 0   No current facility-administered medications for this  visit.     Musculoskeletal: Strength & Muscle Tone: within normal limits Gait & Station: normal Patient leans: N/A  Psychiatric Specialty Exam: Review of Systems  There were no vitals taken for this visit.There is no height or weight on file to calculate BMI.  General Appearance: {Appearance:22683}  Eye Contact:  {BHH EYE CONTACT:22684}  Speech:  Clear and Coherent  Volume:  Normal  Mood:  {BHH MOOD:22306}  Affect:  {Affect (PAA):22687}  Thought Process:  Coherent  Orientation:  Full (Time, Place, and Person)  Thought Content: Logical   Suicidal Thoughts:  {ST/HT (PAA):22692}  Homicidal Thoughts:  {ST/HT (PAA):22692}  Memory:  Immediate;   Good  Judgement:  {Judgement (PAA):22694}  Insight:  {Insight (PAA):22695}  Psychomotor Activity:  Normal  Concentration:  Concentration: Good and Attention Span: Good  Recall:  Good  Fund of Knowledge: Good  Language: Good  Akathisia:  No  Handed:  Right  AIMS (if indicated): not done  Assets:  Communication Skills Desire for Improvement  ADL's:  Intact  Cognition: WNL  Sleep:  {BHH GOOD/FAIR/POOR:22877}   Screenings: GAD-7    Flowsheet Row Office Visit from 05/13/2022 in [facility] Office Visit from 02/15/2022 in [facility]  Total GAD-7 Score 2 0      PHQ2-9    Flowsheet Row Office Visit from 10/20/2022 in [facility] Office Visit from 08/11/2022 in [facility] Office Visit from 05/13/2022 in [facility] Office Visit from 02/15/2022 in [facility] Office Visit from 01/27/2022 in [facility]  PHQ-2 Total Score 0 1 1 1  0  PHQ-9 Total Score -- -- -- 4 --      Flowsheet Row ED from 09/11/2022 in [facility] Office Visit from 05/13/2022 in [facility] Office Visit from 02/15/2022 in [facility]  C-SSRS RISK CATEGORY No Risk No Risk No Risk        Assessment and Plan:  Joshua Gilmore is a 22 y.o. year old male with a history of autism, ADHD, mild intellectual developmental disorder, who presents for follow up appointment for below.    1. Adjustment disorder with mixed anxiety and depressed mood Acute stressors include: work related stress  Other stressors include: loneliness   History: non adherent to medication  Slight worsening in anxiety in the setting of stressors as above.  Given he is no adherence, will switch fluoxetine to target depressed mood and anxiety considering its long half life.    2. Insomnia, unspecified type Coached sleep hygiene, which includes limiting playing video games, doing regular exercise.  He denies any snoring.  Will first work on this sleep hygiene before starting hypnotics.  3. Autism spectrum disorder 3. Intellectual developmental disorder, mild He demonstrated good insight into his childhood, and understanding of his current environment.  Although he used to report interest in filming, he is now interested in becoming a doctor.  During visits since initial evaluation, he has not displayed symptoms to be concerned of psychotic disorder except that he has paranoia/anxiety and some tangential thought process on initial visit, which be more attributable to anxiety. No known history of schizophrenia, and no safety concern of aggression reported by his caregiver.      Plan Discontinue bupropion (was on 150 mg, not adherent) Start fluoxetine 20 mg daily  Referral to therapy to [place] office- message was sent Next appointment: 5/14 at 4 pm for 30 mins, in person   The patient demonstrates the following risk factors for suicide: Chronic risk factors for suicide include: psychiatric disorder of autism, ADHD . Acute risk factors for suicide  include: social withdrawal/isolation. Protective factors for this patient include: positive social support. Considering these factors, the overall suicide risk at this point appears to be low. Patient is appropriate for outpatient follow up.       Collaboration of Care: Collaboration of Care: {BH OP Collaboration of Care:21014065}  Patient/Guardian was advised Release of Information must be obtained prior to any record release in order to collaborate their care with an outside provider. Patient/Guardian was advised if they have not already done so to contact the registration department to sign all necessary forms in order for [place] to release information regarding their care.   Consent: Patient/Guardian gives verbal consent for treatment and assignment of benefits for services provided during this visit. Patient/Guardian expressed understanding and agreed to proceed.    Neysa Hotter, MD 11/28/2022, 11:04 AM

## 2022-11-29 ENCOUNTER — Encounter (HOSPITAL_COMMUNITY): Payer: Self-pay

## 2022-11-29 ENCOUNTER — Ambulatory Visit (INDEPENDENT_AMBULATORY_CARE_PROVIDER_SITE_OTHER): Payer: 59 | Admitting: Clinical

## 2022-11-29 DIAGNOSIS — F902 Attention-deficit hyperactivity disorder, combined type: Secondary | ICD-10-CM | POA: Diagnosis not present

## 2022-11-29 DIAGNOSIS — F4323 Adjustment disorder with mixed anxiety and depressed mood: Secondary | ICD-10-CM | POA: Diagnosis not present

## 2022-11-29 DIAGNOSIS — F840 Autistic disorder: Secondary | ICD-10-CM | POA: Diagnosis not present

## 2022-11-29 NOTE — Progress Notes (Signed)
Virtual Visit via Video Note  I connected with Joshua Gilmore on 11/29/22 at  2:00 PM EDT by a video enabled telemedicine application and verified that I am speaking with the correct person using two identifiers.  Location: Patient: Home Provider: Office   I discussed the limitations of evaluation and management by telemedicine and the availability of in person appointments. The patient expressed understanding and agreed to proceed.   Comprehensive Clinical Assessment (CCA) Note  11/29/2022 Joshua Gilmore [id number]  Chief Complaint: ADHD combined type / Adjustment Disorder / ASD Visit Diagnosis: ADHD combined type / Adjustment Disorder / ASD    CCA Screening, Triage and Referral (STR)  Patient Reported Information How did you hear about [place]? Family/Friend  Referral name: No data recorded Referral phone number: No data recorded  Whom do you see for routine medical problems? No data recorded Practice/Facility Name: No data recorded Practice/Facility Phone Number: No data recorded Name of Contact: No data recorded Contact Number: No data recorded Contact Fax Number: No data recorded Prescriber Name: No data recorded Prescriber Address (if known): No data recorded  What Is the Reason for Your Visit/Call Today? Pt presents to [facility] accompanied by his mother due to insomnia. Pt reports being unable to sleep throughtout the night for the past 3 days. Pts mother reports inconsistency in his medication regimen. Pt denies SI/HI and AVH.  How Long Has This Been Causing You Problems? <Week  What Do You Feel Would Help You the Most Today? Medication(s)   Have You Recently Been in Any Inpatient Treatment (Hospital/Detox/Crisis Center/28-Day Program)? No data recorded Name/Location of Program/Hospital:No data recorded How Long Were You There? No data recorded When Were You Discharged? No data recorded  Have You Ever Received Services From [facility] Before? No data  recorded Who Do You See at [facility]? No data recorded  Have You Recently Had Any Thoughts About Hurting Yourself? No  Are You Planning to Commit Suicide/Harm Yourself At This time? No   Have you Recently Had Thoughts About Hurting Someone Joshua Gilmore? No  Explanation: No data recorded  Have You Used Any Alcohol or Drugs in the Past 24 Hours? No  How Long Ago Did You Use Drugs or Alcohol? No data recorded What Did You Use and How Much? No data recorded  Do You Currently Have a Therapist/Psychiatrist? No data recorded Name of Therapist/Psychiatrist: No data recorded  Have You Been Recently Discharged From Any Office Practice or Programs? No data recorded Explanation of Discharge From Practice/Program: No data recorded    CCA Screening Triage Referral Assessment Type of Contact: No data recorded Is this Initial or Reassessment? No data recorded Date Telepsych consult ordered in [facility]:  No data recorded Time Telepsych consult ordered in [facility]:  No data recorded  Patient Reported Information Reviewed? No data recorded Patient Left Without Being Seen? No data recorded Reason for Not Completing Assessment: No data recorded  Collateral Involvement: No data recorded  Does Patient Have a Court Appointed Legal Guardian? No data recorded Name and Contact of Legal Guardian: No data recorded If Minor and Not Living with Parent(s), Who has Custody? No data recorded Is CPS involved or ever been involved? No data recorded Is APS involved or ever been involved? No data recorded  Patient Determined To Be At Risk for Harm To Self or Others Based on Review of Patient Reported Information or Presenting Complaint? No data recorded Method: No data recorded Availability of Means: No data recorded Intent: No data recorded Notification Required:  No data recorded Additional Information for Danger to Others Potential: No data recorded Additional Comments for Danger to Others Potential: No data  recorded Are There Guns or Other Weapons in Your Home? No data recorded Types of Guns/Weapons: No data recorded Are These Weapons Safely Secured?                            No data recorded Who Could Verify You Are Able To Have These Secured: No data recorded Do You Have any Outstanding Charges, Pending Court Dates, Parole/Probation? No data recorded Contacted To Inform of Risk of Harm To Self or Others: No data recorded  Location of Assessment: No data recorded  Does Patient Present under Involuntary Commitment? No data recorded IVC Papers Initial File Date: No data recorded  [place] of Residence: No data recorded  Patient Currently Receiving the Following Services: No data recorded  Determination of Need: Routine (7 days)   Options For Referral: Outpatient Therapy; Medication Management     CCA Biopsychosocial Intake/Chief Complaint:  The patient spoke about looking to get counseling for management of stressors and notes difficulty with insomnia.  Current Symptoms/Problems: Difficulty with stress management, self criticizes, and insomnia   Patient Reported Schizophrenia/Schizoaffective Diagnosis in Past: No   Strengths: Working Engineer, maintenance (IT) , Risk manager thinking,  Preferences: Risk manager,  Abilities: Video Gaming   Type of Services Patient Feels are Needed: Indivdual Therapy   Initial Clinical Notes/Concerns: The patient notes no prior hospitalizations for MH. Chart indicates evaluation for inpatient due to insomia.   Mental Health Symptoms Depression:   Difficulty Concentrating; Fatigue; Increase/decrease in appetite; Irritability; Sleep (too much or little)   Duration of Depressive symptoms:  Greater than two weeks   Mania:   None   Anxiety:    Difficulty concentrating; Fatigue; Irritability; Restlessness; Sleep   Psychosis:   None   Duration of Psychotic symptoms: NA  Trauma:   None   Obsessions:   None   Compulsions:   None    Inattention:   None   Hyperactivity/Impulsivity:   -- (Patient notes pre- existing dx of ADHD , autism spectrum disorder.)   Oppositional/Defiant Behaviors:  NA  Emotional Irregularity:   None   Other Mood/Personality Symptoms:   NA    Mental Status Exam Appearance and self-care  Stature:   Tall   Weight:   Average weight   Clothing:   Casual   Grooming:   Normal   Cosmetic use:   None   Posture/gait:   Normal   Motor activity:   Not Remarkable   Sensorium  Attention:   Inattentive; Distractible   Concentration:   Scattered   Orientation:   X5   Recall/memory:   Normal   Affect and Mood  Affect:   Appropriate   Mood:  NA  Relating  Eye contact:   None   Facial expression:   Responsive   Attitude toward examiner:   Cooperative   Thought and Language  Speech flow:  Normal   Thought content:   Appropriate to Mood and Circumstances   Preoccupation:   None   Hallucinations:   None   Organization:  Systems analyst of Knowledge:   Good   Intelligence:   Average   Abstraction:   Normal   Judgement:   Normal   Reality Testing:   Realistic   Insight:   Fair   Decision Making:   Normal  Social Functioning  Social Maturity:   Isolates   Social Judgement:   Normal   Stress  Stressors:   Work   Coping Ability:   Normal   Skill Deficits:   None   Supports:   Church; Scientist, forensic system     Religion: Religion/Spirituality Are You A Religious Person?: Yes What is Your Religious Affiliation?: Baptist How Might This Affect Treatment?: Protective Factor  Leisure/Recreation: Leisure / Recreation Do You Have Hobbies?: Yes Leisure and Hobbies: Risk manager  Exercise/Diet: Exercise/Diet Do You Exercise?: No Have You Gained or Lost A Significant Amount of Weight in the Past Six Months?: No Do You Follow a Special Diet?: No Do You Have Any Trouble Sleeping?: Yes Explanation of  Sleeping Difficulties: The patient notes he is having difficulty with falling asleep as well as staying asleep   CCA Employment/Education Employment/Work Situation: Employment / Work Situation Employment Situation: Employed Where is Patient Currently Employed?: [employer] Long has Patient Been Employed?: 30yr and a half Are You Satisfied With Your Job?: Yes Do You Work More Than One Job?: No Work Stressors: The patient confirms there is alot of work stress Patient's Job has Been Impacted by Current Illness: No What is the Longest Time Patient has Held a Job?: same as above Where was the Patient Employed at that Time?: same as above Has Patient ever Been in the [employer]?: No  Education: Education Is Patient Currently Attending School?: No Last Grade Completed: 12 Name of High School: [employer] Did Garment/textile technologist From [employer]?: Yes Did You Attend College?: No Did You Attend Graduate School?: No Did You Have Any Special Interests In School?: NA Did You Have An Individualized Education Program (IIEP): Yes Did You Have Any Difficulty At School?: Yes Were Any Medications Ever Prescribed For These Difficulties?: No Patient's Education Has Been Impacted by Current Illness: No   CCA Family/Childhood History Family and Relationship History: Family history Marital status: Single Are you sexually active?: No What is your sexual orientation?: Heterosexual Has your sexual activity been affected by drugs, alcohol, medication, or emotional stress?: NA Does patient have children?: No  Childhood History:  Childhood History By whom was/is the patient raised?: Mother Additional childhood history information: No Additional Description of patient's relationship with caregiver when they were a child: The patient notes having a good relationship with his Mother Patient's description of current relationship with people who raised him/her: The patient notes having a good  relationship with his Mother How were you disciplined when you got in trouble as a child/adolescent?: electronics taken Does patient have siblings?: Yes Number of Siblings: 1 Description of patient's current relationship with siblings: The patient notes having a good relationship with his older sister Did patient suffer any verbal/emotional/physical/sexual abuse as a child?: No Did patient suffer from severe childhood neglect?: No Has patient ever been sexually abused/assaulted/raped as an adolescent or adult?: No Was the patient ever a victim of a crime or a disaster?: No Witnessed domestic violence?: No Has patient been affected by domestic violence as an adult?: No  Child/Adolescent Assessment:     CCA Substance Use Alcohol/Drug Use: Alcohol / Drug Use Pain Medications: See MAR Prescriptions: None Over the Counter: Ibprofin History of alcohol / drug use?: No history of alcohol / drug abuse Longest period of sobriety (when/how long): NA                         ASAM's:  Six Dimensions of  Multidimensional Assessment  Dimension 1:  Acute Intoxication and/or Withdrawal Potential:      Dimension 2:  Biomedical Conditions and Complications:      Dimension 3:  Emotional, Behavioral, or Cognitive Conditions and Complications:     Dimension 4:  Readiness to Change:     Dimension 5:  Relapse, Continued use, or Continued Problem Potential:     Dimension 6:  Recovery/Living Environment:     ASAM Severity Score:    ASAM Recommended Level of Treatment:     Substance use Disorder (SUD)    Recommendations for Services/Supports/Treatments: Recommendations for Services/Supports/Treatments Recommendations For Services/Supports/Treatments: Individual Therapy, Medication Management  DSM5 Diagnoses: Patient Active Problem List   Diagnosis Date Noted   Acute pain of right knee 10/20/2022   Acute low back pain without sciatica 10/20/2022   Difficulty sleeping 09/11/2022    Depression with anxiety 01/27/2022   Left chronic serous otitis media 08/26/2021   Encounter for general adult medical examination with abnormal findings 07/29/2021   Seasonal allergies 12/10/2020   Alopecia 04/23/2020   Insomnia 04/23/2020   ADHD 04/23/2020   Autism spectrum disorder     Patient Centered Plan: Patient is on the following Treatment Plan(s):  ADHD combined type / Adjustment Disorder with anxiousness and mood / ASD   Referrals to Alternative Service(s): Referred to Alternative Service(s):   Place:   Date:   Time:    Referred to Alternative Service(s):   Place:   Date:   Time:    Referred to Alternative Service(s):   Place:   Date:   Time:    Referred to Alternative Service(s):   Place:   Date:   Time:      Collaboration of Care: No additional collaboration.  Patient/Guardian was advised Release of Information must be obtained prior to any record release in order to collaborate their care with an outside provider. Patient/Guardian was advised if they have not already done so to contact the registration department to sign all necessary forms in order for [place] to release information regarding their care.   Consent: Patient/Guardian gives verbal consent for treatment and assignment of benefits for services provided during this visit. Patient/Guardian expressed understanding and agreed to proceed.    I discussed the assessment and treatment plan with the patient. The patient was provided an opportunity to ask questions and all were answered. The patient agreed with the plan and demonstrated an understanding of the instructions.   The patient was advised to call back or seek an in-person evaluation if the symptoms worsen or if the condition fails to improve as anticipated.  I provided 60 minutes of non-face-to-face time during this encounter.   Winfred Burn, LCSW   11/29/2022

## 2022-11-30 ENCOUNTER — Ambulatory Visit: Payer: 59 | Admitting: Psychiatry

## 2022-12-07 ENCOUNTER — Other Ambulatory Visit: Payer: Self-pay | Admitting: Psychiatry

## 2022-12-09 ENCOUNTER — Other Ambulatory Visit: Payer: Self-pay | Admitting: Psychiatry

## 2022-12-09 ENCOUNTER — Telehealth: Payer: Self-pay

## 2022-12-09 MED ORDER — FLUOXETINE HCL 20 MG PO CAPS: 20.0000 mg | ORAL_CAPSULE | Freq: Every day | ORAL | 0 refills | Status: DC

## 2022-12-09 NOTE — Telephone Encounter (Signed)
pt was left a message that his rx was sent to the pharmcy

## 2022-12-09 NOTE — Telephone Encounter (Signed)
received fax requesting a refill on the fluoxetine 20mg . pt was last seen on 3-28 next appt 6-12    Disp Refills Start End   FLUoxetine (PROZAC) 20 MG capsule 30 capsule 1 10/14/2022 12/13/2022   Sig - Route: Take 1 capsule (20 mg total) by mouth daily. - Oral   Sent to pharmacy as: FLUoxetine (PROZAC) 20 MG capsule   E-Prescribing Status: Receipt confirmed by pharmacy (10/14/2022  2:56 PM EDT)

## 2022-12-09 NOTE — Telephone Encounter (Signed)
Ordered

## 2022-12-26 NOTE — Progress Notes (Unsigned)
BH MD/PA/NP OP Progress Note  12/26/2022 10:33 AM Joshua Gilmore  MRN:  [id number]  Chief Complaint: No chief complaint on file.  HPI: *** According to the chart review, the following events have occurred since the last visit: The patient was seen by Mr. Montez Morita for therapy.   ? Adhd  Visit Diagnosis: No diagnosis found.  Past Psychiatric History: Please see initial evaluation for full details. I have reviewed the history. No updates at this time.     Past Medical History:  Past Medical History:  Diagnosis Date   ADHD (attention deficit hyperactivity disorder)    Allergic rhinitis    Autism spectrum disorder    Generalized anxiety disorder 05/07/2013   No past surgical history on file.  Family Psychiatric History: Please see initial evaluation for full details. I have reviewed the history. No updates at this time.     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  Tobacco Use   Smoking status: Never   Smokeless tobacco: Never  Vaping Use   Vaping Use: Never used  Substance and Sexual Activity   Alcohol use: Never   Drug use: Never   Sexual activity: Not Currently  Other Topics Concern   Not on file  Social History Narrative   Lives with mom,sister away at college   Social Determinants of Health   Financial Resource Strain: Low Risk  (10/18/2022)   Overall Financial Resource Strain (CARDIA)    Difficulty of Paying Living Expenses: Not hard at all  Food Insecurity: No Food Insecurity (10/18/2022)   Hunger  Vital Sign    Worried About Running Out of Food in the Last Year: Never true    Ran Out of Food in the Last Year: Never true  Transportation Needs: No Transportation Needs (10/18/2022)   PRAPARE - Administrator, Civil Service (Medical): No    Lack of Transportation (Non-Medical): No  Physical Activity: Unknown (10/18/2022)   Exercise Vital Sign    Days of Exercise per Week: 0 days    Minutes of Exercise per Session: Not on file  Stress: Stress Concern Present (10/18/2022)   [employer] of Occupational Health - Occupational Stress Questionnaire    Feeling of Stress : To some extent  Social Connections: Unknown (10/18/2022)   Social Connection and Isolation Panel [NHANES]    Frequency of Communication with Friends and Family: More than three times a week    Frequency of Social Gatherings with Friends and Family: Once a week    Attends Religious Services: 1 to 4 times per year    Active Member of [employer] or Organizations: No    Attends Engineer, structural: Not on file    Marital Status: Patient declined    Allergies: No Known Allergies  Metabolic Disorder Labs: No results found for: "HGBA1C", "MPG" No results found for: "PROLACTIN" Lab Results  Component Value Date   CHOL 160 05/02/2020   TRIG 52 05/02/2020  HDL 62 05/02/2020   CHOLHDL 2.6 05/02/2020   LDLCALC 87 05/02/2020   Lab Results  Component Value Date   TSH 0.865 07/29/2021   TSH 1.100 05/02/2020    Therapeutic Level Labs: No results found for: "LITHIUM" No results found for: "VALPROATE" No results found for: "CBMZ"  Current Medications: Current Outpatient Medications  Medication Sig Dispense Refill   FLUoxetine (PROZAC) 20 MG capsule Take 1 capsule (20 mg total) by mouth daily. 30 capsule 0   ibuprofen (ADVIL) 800 MG tablet Take 1 tablet (800 mg total) by mouth every 8 (eight) hours as needed. 30 tablet 0   neomycin-polymyxin-hydrocortisone (CORTISPORIN) 3.5-10000-1 OTIC suspension Place 4  drops into both ears 4 (four) times daily. (Patient not taking: Reported on 10/20/2022) 10 mL 0   No current facility-administered medications for this visit.     Musculoskeletal: Strength & Muscle Tone:  N/A Gait & Station:  N/A Patient leans: N/A  Psychiatric Specialty Exam: Review of Systems  There were no vitals taken for this visit.There is no height or weight on file to calculate BMI.  General Appearance: {Appearance:22683}  Eye Contact:  {BHH EYE CONTACT:22684}  Speech:  Clear and Coherent  Volume:  Normal  Mood:  {BHH MOOD:22306}  Affect:  {Affect (PAA):22687}  Thought Process:  Coherent  Orientation:  Full (Time, Place, and Person)  Thought Content: Logical   Suicidal Thoughts:  {ST/HT (PAA):22692}  Homicidal Thoughts:  {ST/HT (PAA):22692}  Memory:  Immediate;   Good  Judgement:  {Judgement (PAA):22694}  Insight:  {Insight (PAA):22695}  Psychomotor Activity:  Normal  Concentration:  Concentration: Good and Attention Span: Good  Recall:  Good  Fund of Knowledge: Good  Language: Good  Akathisia:  No  Handed:  Right  AIMS (if indicated): not done  Assets:  Communication Skills Desire for Improvement  ADL's:  Intact  Cognition: WNL  Sleep:  {BHH GOOD/FAIR/POOR:22877}   Screenings: GAD-7    Advertising copywriter from 11/29/2022 in [facility] [place] Office Visit from 05/13/2022 in [facility] Office Visit from 02/15/2022 in [facility]  Total GAD-7 Score 8 2 0      PHQ2-9    Flowsheet Row Counselor from 11/29/2022 in [facility] [place] Office Visit from 10/20/2022 in [facility] Office Visit from 08/11/2022 in [facility] Office Visit from 05/13/2022 in [facility] Office Visit from 02[facility]15/2022 in [facility]  PHQ-2 Total Score 1 0 1 1 1   PHQ-9 Total Score 8 -- -- -- 4      Flowsheet Row Counselor from 11/29/2022 in [facility] [place] [facility] from 09/11/2022 in [facility] Visit from 05/13/2022 in [facility]  C-SSRS RISK CATEGORY No Risk No Risk No Risk        Assessment and Plan:  Joshua Gilmore is a 22 y.o. year old male with a history of autism, ADHD, mild intellectual developmental disorder, who presents for follow up appointment for below.    1. Adjustment disorder with mixed anxiety and depressed mood Acute stressors include: work related stress  Other stressors include: loneliness   History: non adherent to medication  Slight worsening in anxiety in the setting of stressors as above.  Given he is no adherence, will switch fluoxetine to target depressed mood and anxiety  considering its long half life.    2. Insomnia, unspecified type Coached sleep hygiene, which includes limiting playing video games, doing regular exercise.  He denies any snoring.  Will first work on this sleep hygiene before starting hypnotics.    3. Autism spectrum disorder 3. Intellectual developmental disorder, mild He demonstrated good insight into his childhood, and understanding of his current environment.  Although he used to report interest in filming, he is now interested in becoming a doctor.  During visits since initial evaluation, he has not displayed symptoms to be concerned of psychotic disorder except that he has paranoia/anxiety and some tangential thought process on initial visit, which be more attributable to anxiety. No known history of schizophrenia, and no safety concern of aggression reported by his caregiver.      Plan Discontinue bupropion (was on 150 mg, not adherent) Start fluoxetine 20 mg daily  Referral to therapy to [place] office- message was sent Next  appointment: 5/14 at 4 pm for 30 mins, in person   The patient demonstrates the following risk factors for suicide: Chronic risk factors for suicide include: psychiatric disorder of autism, ADHD . Acute risk factors for suicide include: social withdrawal/isolation. Protective factors for this patient include: positive social support. Considering these factors, the overall suicide risk at this point appears to be low. Patient is appropriate for outpatient follow up.         Collaboration of Care: Collaboration of Care: {BH OP Collaboration of Care:21014065}  Patient/Guardian was advised Release of Information must be obtained prior to any record release in order to collaborate their care with an outside provider. Patient/Guardian was advised if they have not already done so to contact the registration department to sign all necessary forms in order for [place] to release information regarding their care.   Consent: Patient/Guardian gives verbal consent for treatment and assignment of benefits for services provided during this visit. Patient/Guardian expressed understanding and agreed to proceed.    Neysa Hotter, MD 12/26/2022, 10:33 AM

## 2022-12-29 ENCOUNTER — Encounter: Payer: Self-pay | Admitting: Family Medicine

## 2022-12-29 ENCOUNTER — Telehealth: Payer: Self-pay | Admitting: Psychiatry

## 2022-12-29 ENCOUNTER — Encounter: Payer: 59 | Admitting: Psychiatry

## 2022-12-29 ENCOUNTER — Ambulatory Visit: Payer: Managed Care, Other (non HMO) | Admitting: Family Medicine

## 2022-12-29 VITALS — BP 116/71 | HR 67 | Ht 72.0 in | Wt 144.0 lb

## 2022-12-29 DIAGNOSIS — G479 Sleep disorder, unspecified: Secondary | ICD-10-CM

## 2022-12-29 NOTE — Progress Notes (Signed)
This encounter was created in error - please disregard.

## 2022-12-29 NOTE — Telephone Encounter (Signed)
Sent a video visit link through Epic, but the patient didn't sign in. Tried calling for today's appointment, but got no answer. Left a voicemail instructing the patient to contact the office at [phone].  

## 2022-12-29 NOTE — Assessment & Plan Note (Signed)
Trial OTC Melatonin at bedtime Explained to go to bed at the same time each night and get up at the same time each morning, including on the weekends. Make sure your bedroom is quiet, dark, relaxing, and at a comfortable temperature. Remove electronic devices, such as TVs, computers, and smart phones, from the bedroom.

## 2022-12-29 NOTE — Progress Notes (Signed)
Patient Office Visit   Subjective   Patient ID: Joshua Gilmore, male    DOB: March 18, 2001  Age: 22 y.o. MRN: [id number]  CC:  Chief Complaint  Patient presents with   Follow-up    Patient is here for f/u of sinus issues after being at urgent care a few weeks ago.     HPI Joshua Gilmore 22 year old male, presents to the clinic for difficulty with sleeping He  has a past medical history of ADHD (attention deficit hyperactivity disorder), Allergic rhinitis, Autism spectrum disorder, and Generalized anxiety disorder (05/07/2013).   Primary symptoms: fragmented sleep, frequent awakening.  The current episode started 1 to 4 weeks ago. Frequent awakening occurs nightly and is unchanged since onsent The symptoms are aggravated by work stress. How many beverages per day that contain caffeine: 0 - 1.  Types of beverages you drink: coffee and soda. The symptoms are relieved by darkened room and quiet environment. Past treatments include nothing. Typical bedtime:  10-11 P.M..  How long after going to bed to you fall asleep: less than 15 minutes.   PMH includes: work related stressors.  Prior diagnostic workup includes:  No prior workup.        Outpatient Encounter Medications as of 12/29/2022  Medication Sig   FLUoxetine (PROZAC) 20 MG capsule Take 1 capsule (20 mg total) by mouth daily.   ibuprofen (ADVIL) 800 MG tablet Take 1 tablet (800 mg total) by mouth every 8 (eight) hours as needed.   neomycin-polymyxin-hydrocortisone (CORTISPORIN) 3.5-10000-1 OTIC suspension Place 4 drops into both ears 4 (four) times daily.   No facility-administered encounter medications on file as of 12/29/2022.    No past surgical history on file.  Review of Systems  Constitutional:  Negative for fever.  HENT:  Negative for congestion, ear discharge, ear pain, sinus pain, sore throat and tinnitus.   Eyes:  Negative for blurred vision.  Respiratory:  Negative for shortness of breath.   Cardiovascular:   Negative for chest pain.  Gastrointestinal:  Negative for abdominal pain.  Genitourinary:  Negative for dysuria.  Musculoskeletal:  Negative for myalgias.  Neurological:  Negative for dizziness and headaches.  Psychiatric/Behavioral:  The patient has insomnia. The patient is not nervous/anxious.       Objective    BP 116/71   Pulse 67   Ht 6' (1.829 m)   Wt 144 lb (65.3 kg)   SpO2 98%   BMI 19.53 kg/m   Physical Exam Vitals reviewed.  Constitutional:      General: He is not in acute distress.    Appearance: Normal appearance. He is not ill-appearing, toxic-appearing or diaphoretic.  HENT:     Head: Normocephalic.  Eyes:     General:        Right eye: No discharge.        Left eye: No discharge.     Conjunctiva/sclera: Conjunctivae normal.  Cardiovascular:     Rate and Rhythm: Normal rate.     Pulses: Normal pulses.     Heart sounds: Normal heart sounds.  Pulmonary:     Effort: Pulmonary effort is normal. No respiratory distress.     Breath sounds: Normal breath sounds.  Musculoskeletal:        General: Normal range of motion.     Cervical back: Normal range of motion.  Skin:    General: Skin is warm and dry.     Capillary Refill: Capillary refill takes less than 2 seconds.  Neurological:  General: No focal deficit present.     Mental Status: He is alert and oriented to person, place, and time.     Coordination: Coordination normal.     Gait: Gait normal.  Psychiatric:        Mood and Affect: Mood normal.        Behavior: Behavior normal.       Assessment & Plan:  Difficulty sleeping Assessment & Plan: Trial OTC Melatonin at bedtime Explained to go to bed at the same time each night and get up at the same time each morning, including on the weekends. Make sure your bedroom is quiet, dark, relaxing, and at a comfortable temperature. Remove electronic devices, such as TVs, computers, and smart phones, from the bedroom.      Return if symptoms worsen or  fail to improve.   Cruzita Lederer Newman Nip, FNP

## 2022-12-29 NOTE — Patient Instructions (Signed)
        Great to see you today.   - Please take medications as prescribed. - Follow up with your primary health provider if any health concerns arises. - If symptoms worsen please contact your primary care provider and/or visit the emergency department.  

## 2023-01-06 ENCOUNTER — Other Ambulatory Visit: Payer: Self-pay | Admitting: Psychiatry

## 2023-01-24 ENCOUNTER — Ambulatory Visit: Payer: Managed Care, Other (non HMO) | Admitting: Internal Medicine

## 2023-01-24 ENCOUNTER — Encounter: Payer: Self-pay | Admitting: Internal Medicine

## 2023-01-24 VITALS — BP 119/78 | HR 71 | Ht 72.0 in | Wt 140.8 lb

## 2023-01-24 DIAGNOSIS — F840 Autistic disorder: Secondary | ICD-10-CM | POA: Diagnosis not present

## 2023-01-24 DIAGNOSIS — F418 Other specified anxiety disorders: Secondary | ICD-10-CM

## 2023-01-24 DIAGNOSIS — G4700 Insomnia, unspecified: Secondary | ICD-10-CM

## 2023-01-24 DIAGNOSIS — F410 Panic disorder [episodic paroxysmal anxiety] without agoraphobia: Secondary | ICD-10-CM | POA: Diagnosis not present

## 2023-01-24 MED ORDER — FLUOXETINE HCL 20 MG PO CAPS
20.0000 mg | ORAL_CAPSULE | Freq: Every day | ORAL | 1 refills | Status: DC
Start: 2023-01-24 — End: 2023-04-28

## 2023-01-24 NOTE — Patient Instructions (Signed)
Please continue taking Prozac as prescribed.  Please continue to follow up with Dr. Vanetta Shawl.

## 2023-01-24 NOTE — Progress Notes (Signed)
Acute Office Visit  Subjective:    Patient ID: Joshua Gilmore, male    DOB: October 29, 2000, 22 y.o.   MRN: [id number]  Chief Complaint  Patient presents with   Stress    Patient states he is under stress, had a break down at work and ended up lashing out at work.    HPI Patient is in today for evaluation of recent mental breakdown at his workplace.  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  He had argument with her nurse, who accused him of not cleaning rooms properly and called him "fool".  He had a spell of severe anxiety, lashed out at her and later had panic episode, his arms were crossed and he was shaking.  His mother had to take him home, who is present during the visit today and has helped with the history as well.  He has had similar argument with the same nurse in the past as well and it appears that he has panic episodes during conversation with her.  He reports that he was deemed to be a threat to the nurse and was suspended for a week.  Of note, he is followed by Dr. Vanetta Shawl for autism spectrum disorder and depression with anxiety.  Past Medical History:  Diagnosis Date   ADHD (attention deficit hyperactivity disorder)    Allergic rhinitis    Autism spectrum disorder    Generalized anxiety disorder 05/07/2013    History reviewed. No pertinent surgical history.  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  Tobacco Use   Smoking status: Never   Smokeless tobacco: Never  Vaping Use   Vaping status: Never Used  Substance and Sexual  Activity   Alcohol use: Never   Drug use: Never   Sexual activity: Not Currently  Other Topics Concern   Not on file  Social History Narrative   Lives with mom,sister away at college   Social Determinants of Health   Financial Resource Strain: Low Risk  (10/18/2022)   Overall Financial Resource Strain (CARDIA)    Difficulty of Paying Living Expenses: Not hard at all  Food Insecurity: No Food Insecurity (10/18/2022)   Hunger Vital Sign    Worried About Running Out of Food in the Last Year: Never true    Ran Out of Food in the Last Year: Never true  Transportation Needs: No Transportation Needs (10/18/2022)   PRAPARE - Administrator, Civil Service (Medical): No    Lack of Transportation (Non-Medical): No  Physical Activity: Unknown (10/18/2022)   Exercise Vital Sign    Days of Exercise per Week: 0 days    Minutes of Exercise per Session: Not on file  Stress: Stress Concern Present (10/18/2022)   [employer] of Occupational Health - Occupational Stress Questionnaire    Feeling of Stress : To some extent  Social Connections: Unknown (10/18/2022)   Social Connection and Isolation Panel [NHANES]    Frequency of Communication with Friends and Family:  More than three times a week    Frequency of Social Gatherings with Friends and Family: Once a week    Attends Religious Services: 1 to 4 times per year    Active Member of [employer] or Organizations: No    Attends Engineer, structural: Not on file    Marital Status: Patient declined  Intimate Partner Violence: Not At Risk (04/23/2020)   Humiliation, Afraid, Rape, and Kick questionnaire    Fear of Current or Ex-Partner: No    Emotionally Abused: No    Physically Abused: No    Sexually Abused: No    Outpatient Medications Prior to Visit  Medication Sig Dispense Refill   ibuprofen (ADVIL) 800 MG tablet Take 1 tablet (800 mg total) by mouth every 8 (eight) hours as needed. 30 tablet 0   neomycin-polymyxin-hydrocortisone  (CORTISPORIN) 3.5-10000-1 OTIC suspension Place 4 drops into both ears 4 (four) times daily. 10 mL 0   FLUoxetine (PROZAC) 20 MG capsule Take 1 capsule (20 mg total) by mouth daily. 30 capsule 0   No facility-administered medications prior to visit.    No Known Allergies  Review of Systems  Constitutional:  Negative for chills and fever.  HENT:  Negative for congestion, ear discharge, sinus pressure and sinus pain.   Respiratory:  Negative for cough and shortness of breath.   Cardiovascular:  Negative for chest pain and palpitations.  Gastrointestinal:  Negative for constipation, diarrhea and vomiting.  Genitourinary:  Negative for dysuria and hematuria.  Musculoskeletal:  Positive for back pain. Negative for neck pain and neck stiffness.  Skin:  Negative for rash.  Neurological:  Negative for dizziness and weakness.  Psychiatric/Behavioral:  Positive for agitation and sleep disturbance. Negative for behavioral problems and dysphoric mood. The patient is nervous/anxious.        Objective:    Physical Exam Vitals reviewed.  Constitutional:      General: He is not in acute distress.    Appearance: He is not diaphoretic.  HENT:     Head: Normocephalic and atraumatic.  Eyes:     General: No scleral icterus.    Extraocular Movements: Extraocular movements intact.  Cardiovascular:     Rate and Rhythm: Normal rate and regular rhythm.     Heart sounds: Normal heart sounds. No murmur heard. Pulmonary:     Breath sounds: Normal breath sounds. No wheezing or rales.  Musculoskeletal:     Right knee: No swelling. Normal range of motion. No tenderness.  Skin:    General: Skin is warm.     Findings: No rash.  Neurological:     General: No focal deficit present.     Mental Status: He is alert and oriented to person, place, and time.     Sensory: No sensory deficit.     Motor: No weakness.  Psychiatric:        Mood and Affect: Mood normal.        Speech: Speech is delayed.         Behavior: Behavior is slowed.     BP 119/78 (BP Location: Right Arm, Patient Position: Sitting, Cuff Size: Normal)   Pulse 71   Ht 6' (1.829 m)   Wt 140 lb 12.8 oz (63.9 kg)   SpO2 97%   BMI 19.10 kg/m  Wt Readings from Last 3 Encounters:  01/24/23 140 lb 12.8 oz (63.9 kg)  12/29/22 144 lb (65.3 kg)  10/20/22 144 lb 3.2 oz (65.4 kg)  Assessment & Plan:   Problem List Items Addressed This Visit       Other   Autism spectrum disorder    Followed by psychiatry - Dr. Vanetta Shawl Mother wants personality test done, needs to discuss with her Psychiatrist Needs to have BH therapy      Depression with anxiety    Was well controlled with Prozac, refilled until his next appt with Dr. Vanetta Shawl Likely has panic episodes/PTSD during conversation with the same nurse Will discuss with Dr. Vanetta Shawl about patient's panic episode and if he can have as needed medicine for panic episode Followed by psychiatry - Dr. Vanetta Shawl      Relevant Medications   FLUoxetine (PROZAC) 20 MG capsule   Panic disorder - Primary    His recent episode seems to be panic Needs to continue Prozac for GAD for now, refilled Needs to follow-up with psychiatry He is medically optimized to return to work, letter provided      Relevant Medications   FLUoxetine (PROZAC) 20 MG capsule     Meds ordered this encounter  Medications   FLUoxetine (PROZAC) 20 MG capsule    Sig: Take 1 capsule (20 mg total) by mouth daily.    Dispense:  30 capsule    Refill:  1     Laelle Bridgett Concha Se, MD

## 2023-01-28 DIAGNOSIS — F410 Panic disorder [episodic paroxysmal anxiety] without agoraphobia: Secondary | ICD-10-CM | POA: Insufficient documentation

## 2023-01-28 NOTE — Assessment & Plan Note (Signed)
Followed by psychiatry - Dr. Hisada Mother wants personality test done, needs to discuss with her Psychiatrist Needs to have BH therapy 

## 2023-01-28 NOTE — Assessment & Plan Note (Signed)
His recent episode seems to be panic Needs to continue Prozac for GAD for now, refilled Needs to follow-up with psychiatry He is medically optimized to return to work, letter provided

## 2023-01-28 NOTE — Assessment & Plan Note (Signed)
Was well controlled with Prozac, refilled until his next appt with Dr. Vanetta Shawl Likely has panic episodes/PTSD during conversation with the same nurse Will discuss with Dr. Vanetta Shawl about patient's panic episode and if he can have as needed medicine for panic episode Followed by psychiatry - Dr. Vanetta Shawl

## 2023-02-10 ENCOUNTER — Ambulatory Visit: Payer: 59 | Admitting: Psychiatry

## 2023-02-11 NOTE — Progress Notes (Unsigned)
BH MD/PA/NP OP Progress Note  02/14/2023 5:42 PM Joshua Gilmore  MRN:  [id number]  Chief Complaint:  Chief Complaint  Patient presents with   Follow-up   HPI:  This is a follow-up appointment for adjustment disorder, ADHD. He is interviewed with his grandmother.  He states that he is in the process of finding a job.  He lashed out with interaction with the staff.  He states that he was behind the work, and he was already stressed.  He was told by the staff to do things like everybody else, although he is not like everybody else.  On further elaboration, he wants to stand alone.  He states that he was asked to clean the dining room.  He wanted to start with resents room as he cares more about them.  He was asked to be flexible by the staff.  He got emotional through the interaction.  He thinks he was handling it better when he was on the medication.  He has been taking fluoxetine 5 days a week.  He believes he has been doing much better when he was on bupropion.  He agrees to ask his mother to ensure medication adherence.   His grandmother presents to the room.  She states that she sees him monthly. She thinks he has been doing well. She states that she used to a similar job as Joshua Gilmore in the past, and she thinks the lady/CMA was not supposed to instruct Joshua Gilmore, but handle the trash on her own. She  denies concern about Joshua Gilmore.     Wt Readings from Last 3 Encounters:  02/14/23 145 lb 3.2 oz (65.9 kg)  01/24/23 140 lb 12.8 oz (63.9 kg)  12/29/22 144 lb (65.3 kg)      Visit Diagnosis:    ICD-10-CM   1. Adjustment disorder with mixed anxiety and depressed mood  F43.23     2. Attention deficit hyperactivity disorder (ADHD), predominantly inattentive type  F90.0       Past Psychiatric History: Please see initial evaluation for full details. I have reviewed the history. No updates at this time.     Past Medical History:  Past Medical History:  Diagnosis Date   ADHD (attention deficit  hyperactivity disorder)    Allergic rhinitis    Autism spectrum disorder    Generalized anxiety disorder 05/07/2013   History reviewed. No pertinent surgical history.  Family Psychiatric History: Please see initial evaluation for full details. I have reviewed the history. No updates at this time.     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  Tobacco Use   Smoking status: Never   Smokeless tobacco: Never  Vaping Use   Vaping status: Never Used  Substance and Sexual Activity   Alcohol use: Never   Drug use: Never   Sexual activity: Not Currently  Other Topics Concern   Not on file  Social History Narrative   Lives with mom,sister away at  college   Social Determinants of Health   Financial Resource Strain: Low Risk  (10/18/2022)   Overall Financial Resource Strain (CARDIA)    Difficulty of Paying Living Expenses: Not hard at all  Food Insecurity: No Food Insecurity (10/18/2022)   Hunger Vital Sign    Worried About Running Out of Food in the Last Year: Never true    Ran Out of Food in the Last Year: Never true  Transportation Needs: No Transportation Needs (10/18/2022)   PRAPARE - Administrator, Civil Service (Medical): No    Lack of Transportation (Non-Medical): No  Physical Activity: Unknown (10/18/2022)   Exercise Vital Sign    Days of Exercise per Week: 0 days    Minutes of Exercise per Session: Not on file  Stress: Stress Concern Present (10/18/2022)   [employer] of Occupational Health -  Occupational Stress Questionnaire    Feeling of Stress : To some extent  Social Connections: Unknown (10/18/2022)   Social Connection and Isolation Panel [NHANES]    Frequency of Communication with Friends and Family: More than three times a week    Frequency of Social Gatherings with Friends and Family: Once a week    Attends Religious Services: 1 to 4 times per year    Active Member of Clubs or Organizations: No    Attends Engineer, structural: Not on file    Marital Status: Patient declined    Allergies: No Known Allergies  Metabolic Disorder Labs: No results found for: "HGBA1C", "MPG" No results found for: "PROLACTIN" Lab Results  Component Value Date   CHOL 160 05/02/2020   TRIG 52 05/02/2020   HDL 62 05/02/2020   CHOLHDL 2.6 05/02/2020   LDLCALC 87 05/02/2020   Lab Results  Component Value Date   TSH 0.865 07/29/2021   TSH 1.100 05/02/2020    Therapeutic Level Labs: No results found for: "LITHIUM" No results found for: "VALPROATE" No results found for: "CBMZ"  Current Medications: Current Outpatient Medications  Medication Sig Dispense Refill   buPROPion (WELLBUTRIN XL) 150 MG 24 hr tablet Take 1 tablet (150 mg total) by mouth daily. 30 tablet 1   FLUoxetine (PROZAC) 20 MG capsule Take 1 capsule (20 mg total) by mouth daily. 30 capsule 1   ibuprofen (ADVIL) 800 MG tablet Take 1 tablet (800 mg total) by mouth every 8 (eight) hours as needed. 30 tablet 0   neomycin-polymyxin-hydrocortisone (CORTISPORIN) 3.5-10000-1 OTIC suspension Place 4 drops into both ears 4 (four) times daily. 10 mL 0   No current facility-administered medications for this visit.     Musculoskeletal: Strength & Muscle Tone: within normal limits Gait & Station: normal Patient leans: N/A  Psychiatric Specialty Exam: Review of Systems  Psychiatric/Behavioral:  Positive for decreased concentration and dysphoric mood. Negative for agitation, behavioral problems, confusion,  hallucinations, self-injury, sleep disturbance and suicidal ideas. The patient is nervous/anxious. The patient is not hyperactive.   All other systems reviewed and are negative.   Blood pressure 116/75, pulse 71, temperature 98.5 F (36.9 C), temperature source Skin, height 6' (1.829 m), weight 145 lb 3.2 oz (65.9 kg).Body mass index is 19.69 kg/m.  General Appearance: Fairly Groomed  Eye Contact:  Good  Speech:  Clear and Coherent  Volume:  Normal  Mood:  Anxious  Affect:  Appropriate, Congruent, and calm  Thought Process:  Coherent  Orientation:  Full (Time, Place, and Person)  Thought Content: Logical   Suicidal Thoughts:  No  Homicidal Thoughts:  No  Memory:  Immediate;   Good  Judgement:  Good  Insight:  Good  Psychomotor Activity:  Normal  Concentration:  Concentration: Good and Attention Span: Good  Recall:  Good  Fund of Knowledge: Good  Language: Good  Akathisia:  No  Handed:  Right  AIMS (if indicated): not done  Assets:  Communication Skills Desire for Improvement  ADL's:  Intact  Cognition: WNL  Sleep:  Good   Screenings: GAD-7    Flowsheet Row Office Visit from 02/14/2023 in [facility] Office Visit from 12/29/2022 in [facility] from 11/29/2022 in [facility] [place] Office Visit from 05/13/2022 in [facility] Office Visit from 02/15/2022 in [facility]  Total GAD-7 Score 3 1 8 2  0      PHQ2-9    Flowsheet Row Office Visit from 02/14/2023 in [facility] Office Visit from 01/24/2023 in [facility] Office Visit from 12/29/2022 in [facility] from 11/29/2022 in [facility] [place] Office Visit from 10/20/2022 in  [facility]   PHQ-2 Total Score 0 0 0 1 0  PHQ-9 Total Score -- -- 2 8 --      Flowsheet Row Counselor from 11/29/2022 in [facility] [place] [facility] from 09/11/2022 in [facility] Office Visit from 05/13/2022 in [facility]  C-SSRS RISK CATEGORY No Risk No Risk No Risk        Assessment and Plan:  Joshua Gilmore is a 22 y.o. year old male with a history of autism, ADHD, mild intellectual developmental disorder, who presents for follow up appointment for below.   1. Adjustment disorder with mixed anxiety and depressed mood 2. Attention deficit hyperactivity disorder (ADHD), inattentive type Acute stressors include: unemployment Other stressors include: loneliness   History: non adherent to medication,   received neuropsych testing at [facility] 10/2019 Significant worsening in anxiety, focus in the context of no adherence to medication. He reports strong preference to be back on bupropion, which helped more for both anxiety and attention.  Will honor his preference however she was also provided psycho education about importance of adherence to the medication. He will greatly benefit from CBT; he has an upcoming appointment with Mr. Montez Morita.    2. Insomnia, unspecified type Coached sleep hygiene, which includes limiting playing video games, doing regular exercise.  He denies any snoring.  Will first work on this sleep hygiene before starting hypnotics.    3. Autism spectrum disorder 3. Intellectual developmental disorder, mild - received neuropsych testing at [facility] He demonstrated good insight into his childhood, and understanding of his current environment.  Although he used to report interest in filming, he is now interested in becoming a doctor.  During visits since initial evaluation, he has not displayed symptoms to be concerned of psychotic disorder except that he has paranoia/anxiety and  some tangential thought process on initial visit, which be more attributable to anxiety. No known history of schizophrenia, and no safety concern of aggression reported by his caregiver.      Plan Discontinue fluoxetine  Start bupropion 150 mg daily  Next appointment: 9/16 at 3:30 for 30 mins, in person   The patient demonstrates the following risk factors for suicide: Chronic risk factors for suicide include: psychiatric disorder of  autism, ADHD . Acute risk factors for suicide include: social withdrawal/isolation. Protective factors for this patient include: positive social support. Considering these factors, the overall suicide risk at this point appears to be low. Patient is appropriate for outpatient follow up.     Collaboration of Care: Collaboration of Care: Other reviewed notes in Epic  Patient/Guardian was advised Release of Information must be obtained prior to any record release in order to collaborate their care with an outside provider. Patient/Guardian was advised if they have not already done so to contact the registration department to sign all necessary forms in order for [place] to release information regarding their care.   Consent: Patient/Guardian gives verbal consent for treatment and assignment of benefits for services provided during this visit. Patient/Guardian expressed understanding and agreed to proceed.    Neysa Hotter, MD 02/14/2023, 5:42 PM

## 2023-02-14 ENCOUNTER — Ambulatory Visit (INDEPENDENT_AMBULATORY_CARE_PROVIDER_SITE_OTHER): Payer: 59 | Admitting: Psychiatry

## 2023-02-14 ENCOUNTER — Encounter: Payer: Self-pay | Admitting: Psychiatry

## 2023-02-14 VITALS — BP 116/75 | HR 71 | Temp 98.5°F | Ht 72.0 in | Wt 145.2 lb

## 2023-02-14 DIAGNOSIS — F900 Attention-deficit hyperactivity disorder, predominantly inattentive type: Secondary | ICD-10-CM | POA: Diagnosis not present

## 2023-02-14 DIAGNOSIS — F4323 Adjustment disorder with mixed anxiety and depressed mood: Secondary | ICD-10-CM

## 2023-02-14 MED ORDER — BUPROPION HCL ER (XL) 150 MG PO TB24: 150.0000 mg | ORAL_TABLET | Freq: Every day | ORAL | 1 refills | Status: DC

## 2023-02-14 NOTE — Patient Instructions (Signed)
Discontinue fluoxetine  Start bupropion 150 mg daily  Next appointment- 9/16 at 3:30,

## 2023-02-28 ENCOUNTER — Ambulatory Visit (INDEPENDENT_AMBULATORY_CARE_PROVIDER_SITE_OTHER): Payer: MEDICAID | Admitting: Clinical

## 2023-02-28 DIAGNOSIS — F902 Attention-deficit hyperactivity disorder, combined type: Secondary | ICD-10-CM | POA: Diagnosis not present

## 2023-02-28 DIAGNOSIS — F4323 Adjustment disorder with mixed anxiety and depressed mood: Secondary | ICD-10-CM

## 2023-02-28 DIAGNOSIS — F840 Autistic disorder: Secondary | ICD-10-CM

## 2023-02-28 NOTE — Progress Notes (Signed)
Virtual Visit via Video Note  I connected with Joshua Gilmore on 02/28/23 at  1:00 PM EDT by a video enabled telemedicine application and verified that I am speaking with the correct person using two identifiers.  Location: Patient: home Provider: office   I discussed the limitations of evaluation and management by telemedicine and the availability of in person appointments. The patient expressed understanding and agreed to proceed.  THERAPIST PROGRESS NOTE   Session Time: 1:00 PM- 1:30 PM   Participation Level: Active   Behavioral Response: CasualAlertDepressed   Type of Therapy: Individual Therapy   Treatment Goals addressed: Coping for MH diagnosis   Interventions: CBT, Motivational Interviewing, Strength-based and Supportive   Summary: Joshua Gilmore  is a 22 y.o. male who presents with Adjustment Disorder with mixed anxiety and depressed ,mood/ADHD/ ASD.The OPT therapist worked with the patient for his scheduled OPT treatment. The OPT therapist utilized Motivational Interviewing to assist in creating therapeutic repore. The OPT therapist gained feedback about the patients triggers and symptoms over the past few week.The patient spoke about interactions at home over the past few weeks and spoke about upcoming transition of starting a new job later this week working for [employer] ([place], [place]). The OPT therapist utilized Cognitive Behavioral Therapy through cognitive restructuring as well as worked on coping strategies to assist in management of his mental health symptoms. The OPT therapist worked with the patient overviewed with the patient basic needs areas of sleep, eating habits, hygiene, and exercise. The OPT therapist worked with the patient reviewing the areas the change of starting a new job would impact.The OPT therapist worked with the patient overviewing upcoming listed MyChart appointments.   Suicidal/Homicidal: Nowithout intent/plan   Therapist  Response: The OPT therapist worked with the patient for the patients scheduled session. The patient was engaged in his session and gave feedback in relation to triggers, symptoms, and behavior responses over the past few weeks. The patient idenitfied stressors at home with  starting a new job and having family members be the transportation to the new job; the patient indicated there have been remarks of concern that the patient will not be responsible in being ready for work on time.The OPT therapist worked utilizing an in Psychologist, forensic Therapy exercise. The OPT therapist assessed the patients behaviors and interactions with others. The OPT Therapist assessed the patients mood and anxiety levels over the past few weeks. The OPT therapist worked with the patient in review of upcoming transition.The OPT therapist overviewed with the patient his basic need areas. The spoke about his goal of getting a drivers license and is working as noted by the patient with a program currently to work towards getting his license. The OPT therapist will continue treatment work with the patient in his next session.      Plan: Follow up in 2/3 weeks   Diagnosis:      Axis I:  ADHD/ ASD/ Adjustment disorder with mixed anxiety and depressed mood                          Axis II: No diagnosis   Collaboration of Care: Overview of the patients involvement, effectiveness, and consistency in the med management program with Dr. Adrian Blackwater   Patient/Guardian was advised Release of Information must be obtained prior to any record release in order to collaborate their care with an outside provider. Patient/Guardian was advised if they have not already done so to contact  the registration department to sign all necessary forms in order for [place] to release information regarding their care.    Consent: Patient/Guardian gives verbal consent for treatment and assignment of benefits for services provided during this visit.  Patient/Guardian expressed understanding and agreed to proceed.    I discussed the assessment and treatment plan with the patient. The patient was provided an opportunity to ask questions and all were answered. The patient agreed with the plan and demonstrated an understanding of the instructions.   The patient was advised to call back or seek an in-person evaluation if the symptoms worsen or if the condition fails to improve as anticipated.   I provided 30 minutes of non-face-to-face time during this encounter.     Suzan Garibaldi, LCSW   02/28/2023

## 2023-03-14 ENCOUNTER — Ambulatory Visit: Payer: 59 | Admitting: Psychiatry

## 2023-03-26 NOTE — Progress Notes (Unsigned)
BH MD/PA/NP OP Progress Note  03/26/2023 3:18 PM Joshua Gilmore  MRN:  [id number]  Chief Complaint: No chief complaint on file.  HPI: ***  Adhd evalu  Visit Diagnosis: No diagnosis found.  Past Psychiatric History: Please see initial evaluation for full details. I have reviewed the history. No updates at this time.     Past Medical History:  Past Medical History:  Diagnosis Date   ADHD (attention deficit hyperactivity disorder)    Allergic rhinitis    Autism spectrum disorder    Generalized anxiety disorder 05/07/2013   No past surgical history on file.  Family Psychiatric History: Please see initial evaluation for full details. I have reviewed the history. No updates at this time.     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  Tobacco Use   Smoking status: Never   Smokeless tobacco: Never  Vaping Use   Vaping status: Never Used  Substance and Sexual Activity   Alcohol use: Never   Drug use: Never   Sexual activity: Not Currently  Other Topics Concern   Not on file  Social History Narrative   Lives with mom,sister away at college   Social Determinants of Health   Financial Resource Strain: Low Risk  (10/18/2022)   Overall Financial Resource Strain (CARDIA)    Difficulty of Paying Living Expenses: Not hard at all  Food Insecurity: No Food Insecurity (10/18/2022)   Hunger Vital Sign    Worried About Running Out of Food in the Last Year: Never true    Ran Out of Food in the Last Year: Never true   Transportation Needs: No Transportation Needs (10/18/2022)   PRAPARE - Administrator, Civil Service (Medical): No    Lack of Transportation (Non-Medical): No  Physical Activity: Unknown (10/18/2022)   Exercise Vital Sign    Days of Exercise per Week: 0 days    Minutes of Exercise per Session: Not on file  Stress: Stress Concern Present (10/18/2022)   [employer] of Occupational Health - Occupational Stress Questionnaire    Feeling of Stress : To some extent  Social Connections: Unknown (10/18/2022)   Social Connection and Isolation Panel [NHANES]    Frequency of Communication with Friends and Family: More than three times a week    Frequency of Social Gatherings with Friends and Family: Once a week    Attends Religious Services: 1 to 4 times per year    Active Member of [employer] or Organizations: No    Attends Engineer, structural: Not on file    Marital Status: Patient declined    Allergies: No Known Allergies  Metabolic Disorder Labs: No results found for: "HGBA1C", "MPG" No results found for: "PROLACTIN" Lab Results  Component Value Date   CHOL 160 05/02/2020   TRIG 52 05/02/2020   HDL 62 05/02/2020   CHOLHDL 2.6 05/02/2020   LDLCALC 87 05/02/2020   Lab Results  Component Value Date  TSH 0.865 07/29/2021   TSH 1.100 05/02/2020    Therapeutic Level Labs: No results found for: "LITHIUM" No results found for: "VALPROATE" No results found for: "CBMZ"  Current Medications: Current Outpatient Medications  Medication Sig Dispense Refill   buPROPion (WELLBUTRIN XL) 150 MG 24 hr tablet Take 1 tablet (150 mg total) by mouth daily. 30 tablet 1   FLUoxetine (PROZAC) 20 MG capsule Take 1 capsule (20 mg total) by mouth daily. 30 capsule 1   ibuprofen (ADVIL) 800 MG tablet Take 1 tablet (800 mg total) by mouth every 8 (eight) hours as needed. 30 tablet 0   neomycin-polymyxin-hydrocortisone (CORTISPORIN) 3.5-10000-1 OTIC suspension Place 4 drops into both ears  4 (four) times daily. 10 mL 0   No current facility-administered medications for this visit.     Musculoskeletal: Strength & Muscle Tone: within normal limits Gait & Station: normal Patient leans: N/A  Psychiatric Specialty Exam: Review of Systems  There were no vitals taken for this visit.There is no height or weight on file to calculate BMI.  General Appearance: Fairly Groomed  Eye Contact:  Good  Speech:  Clear and Coherent  Volume:  Normal  Mood:  {BHH MOOD:22306}  Affect:  {Affect (PAA):22687}  Thought Process:  Coherent  Orientation:  Full (Time, Place, and Person)  Thought Content: Logical   Suicidal Thoughts:  {ST/HT (PAA):22692}  Homicidal Thoughts:  {ST/HT (PAA):22692}  Memory:  Immediate;   Good  Judgement:  {Judgement (PAA):22694}  Insight:  {Insight (PAA):22695}  Psychomotor Activity:  Normal  Concentration:  Concentration: Good and Attention Span: Good  Recall:  Good  Fund of Knowledge: Good  Language: Good  Akathisia:  No  Handed:  Right  AIMS (if indicated): not done  Assets:  Communication Skills Desire for Improvement  ADL's:  Intact  Cognition: WNL  Sleep:  {BHH GOOD/FAIR/POOR:22877}   Screenings: GAD-7    Flowsheet Row Office Visit from 02/14/2023 in [facility] Office Visit from 12/29/2022 in [facility] Counselor from 11/29/2022 in [facility] [place] Office Visit from 05/13/2022 in [facility] Office Visit from 02/15/2022 in [facility]  Total GAD-7 Score 3 1 8 2  0      PHQ2-9    Flowsheet Row Office Visit from 02/14/2023 in [facility] Office Visit from 01/24/2023 in [facility] Office Visit from 12/29/2022 in [facility] Counselor from 11/29/2022 in [facility] at [place] Office Visit from 10/20/2022 in [facility]  PHQ-2 Total Score 0 0 0 1 0  PHQ-9 Total Score -- -- 2 8 --      Flowsheet Row Counselor from 11/29/2022 in [facility] [place] ED from 09/11/2022 in [facility] Office Visit from 05/13/2022 in [facility]  C-SSRS RISK CATEGORY No Risk No Risk No Risk        Assessment and Plan:  Joshua Gilmore is a 22 y.o. year old male with a history of autism, ADHD, mild intellectual developmental disorder, who presents for follow up appointment for below.    1. Adjustment disorder with mixed anxiety and depressed mood 2. Attention deficit hyperactivity disorder (ADHD), inattentive type Acute stressors include: unemployment Other stressors include: loneliness   History: non adherent to medication,   received neuropsych testing at [facility]  10/2019 Significant worsening in anxiety, focus in the context of no adherence to medication. He reports strong preference to be back on bupropion, which helped more for both anxiety and attention.  Will honor his preference however she was also provided psycho education about importance of adherence to the medication. He will greatly benefit from CBT; he has an upcoming appointment with Mr. Montez Morita.    2. Insomnia, unspecified type Coached sleep hygiene, which includes limiting playing video games, doing regular exercise.  He denies any snoring.  Will first work on this sleep hygiene before starting hypnotics.    3. Autism spectrum disorder 3. Intellectual developmental disorder, mild - received neuropsych testing at [facility] He demonstrated good insight into his childhood, and understanding of his current environment.  Although he used to report interest in filming, he is now interested in becoming a doctor.  During visits since initial evaluation, he has not displayed  symptoms to be concerned of psychotic disorder except that he has paranoia/anxiety and some tangential thought process on initial visit, which be more attributable to anxiety. No known history of schizophrenia, and no safety concern of aggression reported by his caregiver.      Plan Discontinue fluoxetine  Start bupropion 150 mg daily  Next appointment: 9/16 at 3:30 for 30 mins, in person   The patient demonstrates the following risk factors for suicide: Chronic risk factors for suicide include: psychiatric disorder of autism, ADHD . Acute risk factors for suicide include: social withdrawal/isolation. Protective factors for this patient include: positive social support. Considering these factors, the overall suicide risk at this point appears to be low. Patient is appropriate for outpatient follow up.   Collaboration of Care: Collaboration of Care: {BH OP Collaboration of Care:21014065}  Patient/Guardian was advised Release of Information must be obtained prior to any record release in order to collaborate their care with an outside provider. Patient/Guardian was advised if they have not already done so to contact the registration department to sign all necessary forms in order for [place] to release information regarding their care.   Consent: Patient/Guardian gives verbal consent for treatment and assignment of benefits for services provided during this visit. Patient/Guardian expressed understanding and agreed to proceed.    Neysa Hotter, MD 03/26/2023, 3:18 PM

## 2023-03-31 ENCOUNTER — Encounter: Payer: Self-pay | Admitting: Psychiatry

## 2023-03-31 ENCOUNTER — Ambulatory Visit (INDEPENDENT_AMBULATORY_CARE_PROVIDER_SITE_OTHER): Payer: BC Managed Care – PPO | Admitting: Psychiatry

## 2023-03-31 VITALS — BP 129/86 | HR 67 | Temp 98.1°F | Ht 72.0 in | Wt 140.2 lb

## 2023-03-31 DIAGNOSIS — F902 Attention-deficit hyperactivity disorder, combined type: Secondary | ICD-10-CM | POA: Diagnosis not present

## 2023-03-31 DIAGNOSIS — F4323 Adjustment disorder with mixed anxiety and depressed mood: Secondary | ICD-10-CM

## 2023-03-31 MED ORDER — BUPROPION HCL ER (XL) 150 MG PO TB24: 150.0000 mg | ORAL_TABLET | Freq: Every day | ORAL | 2 refills | Status: DC

## 2023-03-31 NOTE — Patient Instructions (Signed)
Continue bupropion 150 mg daily  Next appointment: 12/4 at 1:40, video

## 2023-04-05 ENCOUNTER — Ambulatory Visit (HOSPITAL_COMMUNITY): Payer: 59 | Admitting: Clinical

## 2023-04-28 ENCOUNTER — Encounter: Payer: Self-pay | Admitting: Internal Medicine

## 2023-04-28 ENCOUNTER — Ambulatory Visit (INDEPENDENT_AMBULATORY_CARE_PROVIDER_SITE_OTHER): Payer: MEDICAID | Admitting: Internal Medicine

## 2023-04-28 VITALS — BP 113/73 | HR 69 | Ht 72.0 in | Wt 141.0 lb

## 2023-04-28 DIAGNOSIS — M545 Low back pain, unspecified: Secondary | ICD-10-CM

## 2023-04-28 DIAGNOSIS — Z23 Encounter for immunization: Secondary | ICD-10-CM

## 2023-04-28 MED ORDER — CYCLOBENZAPRINE HCL 5 MG PO TABS
5.0000 mg | ORAL_TABLET | Freq: Two times a day (BID) | ORAL | 0 refills | Status: DC | PRN
Start: 2023-04-28 — End: 2024-03-26

## 2023-04-28 MED ORDER — IBUPROFEN 800 MG PO TABS: 800.0000 mg | ORAL_TABLET | Freq: Three times a day (TID) | ORAL | 0 refills | Status: DC | PRN

## 2023-04-28 NOTE — Patient Instructions (Addendum)
Please take Flexeril as needed for muscle spasms.  Please apply heating pad and/or back brace for back pain.  Avoid heavy lifting and frequent bending.

## 2023-04-28 NOTE — Assessment & Plan Note (Signed)
Likely due to heavy lifting and frequent bending Needs to have proper training at work to avoid strain on the back while lifting weights Simple back exercises material provided Ibuprofen as needed for pain Flexeril as needed for muscle spasms Heating pad and/or back brace as needed

## 2023-04-28 NOTE — Progress Notes (Signed)
Acute Office Visit  Subjective:    Patient ID: Joshua Gilmore, male    DOB: 12/31/2000, 22 y.o.   MRN: [id number]  Chief Complaint  Patient presents with   Back Pain    Pain in the middle of back for a couple weeks     HPI Patient is in today for lower back pain, which is present for since 04/14/23. Pain is constant, worse with bending and is nonradiating. Denies any numbness, tingling or weakness of LE. His pain is worse at the end of his shift. Denies any recent injury.  He has recently started working at [employer] and has to perform heavy lifting and moving. He works in Art therapist.  He has tried ibuprofen with mild relief.  His x-ray of lumbar spine in 01/23 showed no scoliosis.   Past Medical History:  Diagnosis Date   ADHD (attention deficit hyperactivity disorder)    Allergic rhinitis    Autism spectrum disorder    Generalized anxiety disorder 05/07/2013    History reviewed. No pertinent surgical history.  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  Tobacco Use   Smoking status: Never   Smokeless tobacco: Never  Vaping Use   Vaping status: Never Used  Substance and Sexual Activity   Alcohol use: Never   Drug use: Never   Sexual activity: Not Currently  Other Topics Concern   Not on file  Social History Narrative   Lives with mom,sister away at college   Social Determinants of Health   Financial Resource Strain: Low Risk  (10/18/2022)   Overall Financial Resource Strain (CARDIA)    Difficulty of Paying Living  Expenses: Not hard at all  Food Insecurity: No Food Insecurity (10/18/2022)   Hunger Vital Sign    Worried About Running Out of Food in the Last Year: Never true    Ran Out of Food in the Last Year: Never true  Transportation Needs: No Transportation Needs (10/18/2022)   PRAPARE - Administrator, Civil Service (Medical): No    Lack of Transportation (Non-Medical): No  Physical Activity: Unknown (10/18/2022)   Exercise Vital Sign    Days of Exercise per Week: 0 days    Minutes of Exercise per Session: Not on file  Stress: Stress Concern Present (10/18/2022)   [employer] of Occupational Health - Occupational Stress Questionnaire    Feeling of Stress : To some extent  Social Connections: Unknown (10/18/2022)   Social Connection and Isolation Panel [NHANES]    Frequency of Communication with Friends and Family: More than three times a week    Frequency of Social Gatherings with Friends and Family: Once a week    Attends Religious Services: 1 to 4 times per year    Active Member of [employer] or Organizations: No    Attends Banker Meetings: Not on file    Marital Status: Patient declined  Intimate Partner Violence: Not At Risk (04/23/2020)   Humiliation, Afraid, Rape,  and Kick questionnaire    Fear of Current or Ex-Partner: No    Emotionally Abused: No    Physically Abused: No    Sexually Abused: No    Outpatient Medications Prior to Visit  Medication Sig Dispense Refill   buPROPion (WELLBUTRIN XL) 150 MG 24 hr tablet Take 1 tablet (150 mg total) by mouth daily. 30 tablet 2   neomycin-polymyxin-hydrocortisone (CORTISPORIN) 3.5-10000-1 OTIC suspension Place 4 drops into both ears 4 (four) times daily. 10 mL 0   FLUoxetine (PROZAC) 20 MG capsule Take 1 capsule (20 mg total) by mouth daily. (Patient not taking: Reported on 03/31/2023) 30 capsule 1   ibuprofen (ADVIL) 800 MG tablet Take 1 tablet (800 mg total) by mouth every 8 (eight) hours as needed. 30 tablet 0   No  facility-administered medications prior to visit.    No Known Allergies  Review of Systems  Constitutional:  Negative for chills and fever.  HENT:  Negative for congestion, ear discharge, sinus pressure and sinus pain.   Respiratory:  Negative for cough and shortness of breath.   Cardiovascular:  Negative for chest pain and palpitations.  Gastrointestinal:  Negative for constipation, diarrhea and vomiting.  Genitourinary:  Negative for dysuria and hematuria.  Musculoskeletal:  Positive for back pain. Negative for neck pain and neck stiffness.  Skin:  Negative for rash.  Neurological:  Negative for dizziness and weakness.  Psychiatric/Behavioral:  Positive for agitation. Negative for behavioral problems and dysphoric mood. The patient is nervous/anxious.        Objective:    Physical Exam Vitals reviewed.  Constitutional:      General: He is not in acute distress.    Appearance: He is not diaphoretic.  HENT:     Head: Normocephalic and atraumatic.  Eyes:     General: No scleral icterus.    Extraocular Movements: Extraocular movements intact.  Cardiovascular:     Rate and Rhythm: Normal rate and regular rhythm.     Heart sounds: Normal heart sounds. No murmur heard. Pulmonary:     Breath sounds: Normal breath sounds. No wheezing or rales.  Musculoskeletal:     Lumbar back: Tenderness (Paraspinal tenderness in upper lumbar area) present. Negative right straight leg raise test and negative left straight leg raise test.     Right knee: No swelling. Normal range of motion. No tenderness.  Skin:    General: Skin is warm.     Findings: No rash.  Neurological:     General: No focal deficit present.     Mental Status: He is alert and oriented to person, place, and time.     Sensory: No sensory deficit.     Motor: No weakness.  Psychiatric:        Mood and Affect: Mood normal.        Speech: Speech is delayed.        Behavior: Behavior is slowed.     BP 113/73 (BP Location:  Right Arm, Patient Position: Sitting, Cuff Size: Normal)   Pulse 69   Ht 6' (1.829 m)   Wt 141 lb (64 kg)   SpO2 98%   BMI 19.12 kg/m  Wt Readings from Last 3 Encounters:  04/28/23 141 lb (64 kg)  03/31/23 140 lb 3.2 oz (63.6 kg)  02/14/23 145 lb 3.2 oz (65.9 kg)        Assessment & Plan:   Problem List Items Addressed This Visit       Other   Acute low  back pain without sciatica - Primary    Likely due to heavy lifting and frequent bending Needs to have proper training at work to avoid strain on the back while lifting weights Simple back exercises material provided Ibuprofen as needed for pain Flexeril as needed for muscle spasms Heating pad and/or back brace as needed      Relevant Medications   ibuprofen (ADVIL) 800 MG tablet   cyclobenzaprine (FLEXERIL) 5 MG tablet   Other Visit Diagnoses     Encounter for immunization       Relevant Orders   Flu vaccine trivalent PF, 6mos and older(Flulaval,Afluria,Fluarix,Fluzone) (Completed)        Meds ordered this encounter  Medications   ibuprofen (ADVIL) 800 MG tablet    Sig: Take 1 tablet (800 mg total) by mouth every 8 (eight) hours as needed.    Dispense:  30 tablet    Refill:  0   cyclobenzaprine (FLEXERIL) 5 MG tablet    Sig: Take 1 tablet (5 mg total) by mouth 2 (two) times daily as needed for muscle spasms.    Dispense:  30 tablet    Refill:  0     Louana Fontenot Concha Se, MD

## 2023-05-18 ENCOUNTER — Ambulatory Visit (HOSPITAL_COMMUNITY): Payer: MEDICAID | Admitting: Clinical

## 2023-05-18 DIAGNOSIS — F4323 Adjustment disorder with mixed anxiety and depressed mood: Secondary | ICD-10-CM

## 2023-05-18 DIAGNOSIS — F840 Autistic disorder: Secondary | ICD-10-CM | POA: Diagnosis not present

## 2023-05-18 DIAGNOSIS — F902 Attention-deficit hyperactivity disorder, combined type: Secondary | ICD-10-CM

## 2023-05-18 NOTE — Progress Notes (Signed)
Virtual Visit via Video Note   I connected with Joshua Gilmore on 05/18/23 at  1:00 PM EDT by a video enabled telemedicine application and verified that I am speaking with the correct person using two identifiers.   Location: Patient: home Provider: office   I discussed the limitations of evaluation and management by telemedicine and the availability of in person appointments. The patient expressed understanding and agreed to proceed.   THERAPIST PROGRESS NOTE   Session Time: 1:00 PM- 1:30 PM   Participation Level: Active   Behavioral Response: CasualAlertDepressed   Type of Therapy: Individual Therapy   Treatment Goals addressed: Coping for MH diagnosis   Interventions: CBT, Motivational Interviewing, Strength-based and Supportive   Summary: Joshua Gilmore  is a 22 y.o. male who presents with Adjustment Disorder with mixed anxiety and depressed ,mood/ADHD/ ASD.The OPT therapist worked with the patient for his scheduled OPT treatment. The OPT therapist utilized Motivational Interviewing to assist in creating therapeutic repore. The OPT therapist gained feedback about the patients triggers and symptoms over the past few week.The patient spoke about interactions at home over the past few weeks and spoke about starting a new job later this week working for [employer] ([place]) and notes he is currently working around 5 days a week. The OPT therapist utilized Cognitive Behavioral Therapy through cognitive restructuring as well as worked on coping strategies to assist in management of his mental health symptoms. The OPT therapist worked with the patient overviewed with the patient basic needs areas of sleep, eating habits, hygiene, and exercise. The OPT therapist worked with the patient spoke about being optimistic and not having difficulty with anxiety through the transition.The patient noted a medication change recently at last med therapy appointment  transitioning back to a medication he had success with before and noted since transitioning back to the prior medication he feels this is working well in management of his MH symptoms.The OPT therapist worked with the patient overviewing upcoming listed MyChart appointments.   Suicidal/Homicidal: Nowithout intent/plan   Therapist Response: The OPT therapist worked with the patient for the patients scheduled session. The patient was engaged in his session and gave feedback in relation to triggers, symptoms, and behavior responses over the past few weeks. The patient identified change with his most recent med therapy appointment going back to a prior medication he has had success with and since this change he feels his medication is helping more in management of his MH symptoms. The OPT therapist worked utilizing an in Psychologist, forensic Therapy exercise. The OPT therapist assessed the patients behaviors and interactions with others. The OPT Therapist assessed the patients mood and anxiety levels over the past few weeks. The OPT therapist overviewed through in session exercise coping strategies for work and home.The OPT therapist worked with the patient in review of adjustment to starting a job and noted that he is being considered for a promotion already at his job. The OPT therapist overviewed with the patient his basic need areas. The patient spoke about his plans for this upcoming holidays. The OPT therapist will continue treatment work with the patient in his next session.      Plan: Follow up in 2/3 weeks   Diagnosis:      Axis I:  ADHD/ ASD/ Adjustment disorder with mixed anxiety and depressed mood                           Axis  II: No diagnosis   Collaboration of Care: Overview of the patients involvement, effectiveness, and consistency in the med management program with Dr. Adrian Blackwater   Patient/Guardian was advised Release of Information must be obtained prior to any record release in  order to collaborate their care with an outside provider. Patient/Guardian was advised if they have not already done so to contact the registration department to sign all necessary forms in order for Joshua Gilmore to release information regarding their care.    Consent: Patient/Guardian gives verbal consent for treatment and assignment of benefits for services provided during this visit. Patient/Guardian expressed understanding and agreed to proceed.    I discussed the assessment and treatment plan with the patient. The patient was provided an opportunity to ask questions and all were answered. The patient agreed with the plan and demonstrated an understanding of the instructions.   The patient was advised to call back or seek an in-person evaluation if the symptoms worsen or if the condition fails to improve as anticipated.   I provided 30 minutes of non-face-to-face time during this encounter.     Suzan Garibaldi, LCSW   05/18/2023

## 2023-06-08 ENCOUNTER — Encounter: Payer: Self-pay | Admitting: Emergency Medicine

## 2023-06-08 ENCOUNTER — Other Ambulatory Visit: Payer: Self-pay

## 2023-06-08 ENCOUNTER — Ambulatory Visit
Admission: EM | Admit: 2023-06-08 | Discharge: 2023-06-08 | Disposition: A | Discharge status: Home / Self Care | Payer: MEDICAID | Attending: Family Medicine | Admitting: Family Medicine

## 2023-06-08 DIAGNOSIS — J069 Acute upper respiratory infection, unspecified: Secondary | ICD-10-CM | POA: Diagnosis not present

## 2023-06-08 LAB — POC COVID19/FLU A&B COMBO
Covid Antigen, POC: NEGATIVE
Influenza A Antigen, POC: NEGATIVE
Influenza B Antigen, POC: NEGATIVE

## 2023-06-08 MED ORDER — PROMETHAZINE-DM 6.25-15 MG/5ML PO SYRP: 5.0000 mL | ORAL_SOLUTION | Freq: Four times a day (QID) | ORAL | 0 refills | Status: DC | PRN

## 2023-06-08 MED ORDER — FLUTICASONE PROPIONATE 50 MCG/ACT NA SUSP: 1.0000 | Freq: Two times a day (BID) | NASAL | 2 refills | Status: DC

## 2023-06-08 NOTE — ED Triage Notes (Addendum)
Pt reports nasal congestion, facial pressure for last several days. Denies any fevers.   Pt family inquiring about covid and flu test.

## 2023-06-08 NOTE — ED Provider Notes (Signed)
[facility]    CSN: [id number] Arrival date & time: 06/08/23  1538      History   Chief Complaint Chief Complaint  Patient presents with   Nasal Congestion    HPI Joshua Gilmore is a 22 y.o. male.   Presenting today with several day history of nasal congestion, facial pressure, cough, scratchy throat.  Denies chest pain, shortness of breath, fever, chills, body aches, abdominal pain, nausea vomiting or diarrhea.  So far trying over-the-counter cold and congestion medication with minimal relief.    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 No pertinent surgical history.     Home Medications    Prior to Admission medications   Medication Sig Start Date End Date Taking? Authorizing Provider  fluticasone (FLONASE) 50 MCG/ACT nasal spray Place 1 spray into both nostrils 2 (two) times daily. 06/08/23  Yes Particia Nearing, PA-C  promethazine-dextromethorphan (PROMETHAZINE-DM) 6.25-15 MG/5ML syrup Take 5 mLs by mouth 4 (four) times daily as needed. 06/08/23  Yes Particia Nearing, PA-C  buPROPion (WELLBUTRIN XL) 150 MG 24 hr tablet Take 1 tablet (150 mg total) by mouth daily. 04/15/23 07/14/23  Neysa Hotter, MD  cyclobenzaprine (FLEXERIL) 5 MG tablet Take 1 tablet (5 mg total) by mouth 2 (two) times  daily as needed for muscle spasms. 04/28/23   Anabel Halon, MD  ibuprofen (ADVIL) 800 MG tablet Take 1 tablet (800 mg total) by mouth every 8 (eight) hours as needed. 04/28/23   Anabel Halon, MD  neomycin-polymyxin-hydrocortisone (CORTISPORIN) 3.5-10000-1 OTIC suspension Place 4 drops into both ears 4 (four) times daily. 08/11/22   Anabel Halon, MD    Family History Family History  Problem Relation Age of Onset   Hypertension Mother    Learning disabilities Sister    Alcohol abuse Maternal Uncle    Alcohol abuse Paternal Uncle    Autism spectrum disorder Cousin     Social History Social History   Tobacco Use   Smoking status: Never   Smokeless tobacco: Never  Vaping Use   Vaping status: Never Used  Substance Use Topics   Alcohol use: Never   Drug use: Never     Allergies   Patient has no known allergies.   Review of Systems Review of Systems Per HPI  Physical Exam Triage Vital Signs ED Triage Vitals  Encounter Vitals Group     BP 06/08/23 1645 128/83     Systolic BP Percentile --      Diastolic BP Percentile --      Pulse Rate 06/08/23 1645 84     Resp 06/08/23 1645 20     Temp 06/08/23 1645 99.1 F (37.3 C)     Temp Source 06/08/23 1645 Oral     SpO2 06/08/23 1645 95 %     Weight --  Height --      Head Circumference --      Peak Flow --      Pain Score 06/08/23 1644 1     Pain Loc --      Pain Education --      Exclude from Growth Chart --    No data found.  Updated Vital Signs BP 128/83 (BP Location: Right Arm)   Pulse 84   Temp 99.1 F (37.3 C) (Oral)   Resp 20   SpO2 95%   Visual Acuity Right Eye Distance:   Left Eye Distance:   Bilateral Distance:    Right Eye Near:   Left Eye Near:    Bilateral Near:     Physical Exam Vitals and nursing note reviewed.  Constitutional:      Appearance: He is well-developed.  HENT:     Head: Atraumatic.     Right Ear: External ear normal.     Left Ear: External ear normal.      Nose: Rhinorrhea present.     Mouth/Throat:     Pharynx: Posterior oropharyngeal erythema present. No oropharyngeal exudate.  Eyes:     Conjunctiva/sclera: Conjunctivae normal.     Pupils: Pupils are equal, round, and reactive to light.  Cardiovascular:     Rate and Rhythm: Normal rate and regular rhythm.  Pulmonary:     Effort: Pulmonary effort is normal. No respiratory distress.     Breath sounds: No wheezing or rales.  Musculoskeletal:        General: Normal range of motion.     Cervical back: Normal range of motion and neck supple.  Lymphadenopathy:     Cervical: No cervical adenopathy.  Skin:    General: Skin is warm and dry.  Neurological:     Mental Status: He is alert and oriented to person, place, and time.  Psychiatric:        Behavior: Behavior normal.      UC Treatments / Results  Labs (all labs ordered are listed, but only abnormal results are displayed) Labs Reviewed  POC COVID19/FLU A&B COMBO    EKG   Radiology No results found.  Procedures Procedures (including critical care time)  Medications Ordered in UC Medications - No data to display  Initial Impression / Assessment and Plan / UC Course  I have reviewed the triage vital signs and the nursing notes.  Pertinent labs & imaging results that were available during my care of the patient were reviewed by me and considered in my medical decision making (see chart for details).     Rapid flu and COVID-negative, treat viral upper respiratory infection with Phenergan DM, Flonase, supportive the counter medications and home care.  Return for worsening symptoms.  Final Clinical Impressions(s) / UC Diagnoses   Final diagnoses:  Viral URI with cough   Discharge Instructions   None    ED Prescriptions     Medication Sig Dispense Auth. Provider   promethazine-dextromethorphan (PROMETHAZINE-DM) 6.25-15 MG/5ML syrup Take 5 mLs by mouth 4 (four) times daily as needed. 100 mL Particia Nearing,  PA-C   fluticasone [facility]) 50 MCG/ACT nasal spray Place 1 spray into both nostrils 2 (two) times daily. 16 g Particia Nearing, [place]      PDMP not reviewed this encounter.   Particia Nearing, [place] 06/08/23 1815

## 2023-06-09 IMAGING — DX DG LUMBAR SPINE COMPLETE 4+V
5 series · 5 of 5 positions shown · non-contrast
Comparison: None.

CLINICAL DATA: Acute low back pain.  History of scoliosis.

EXAM:
LUMBAR SPINE - COMPLETE 4+ VIEW

[l-spine ap]
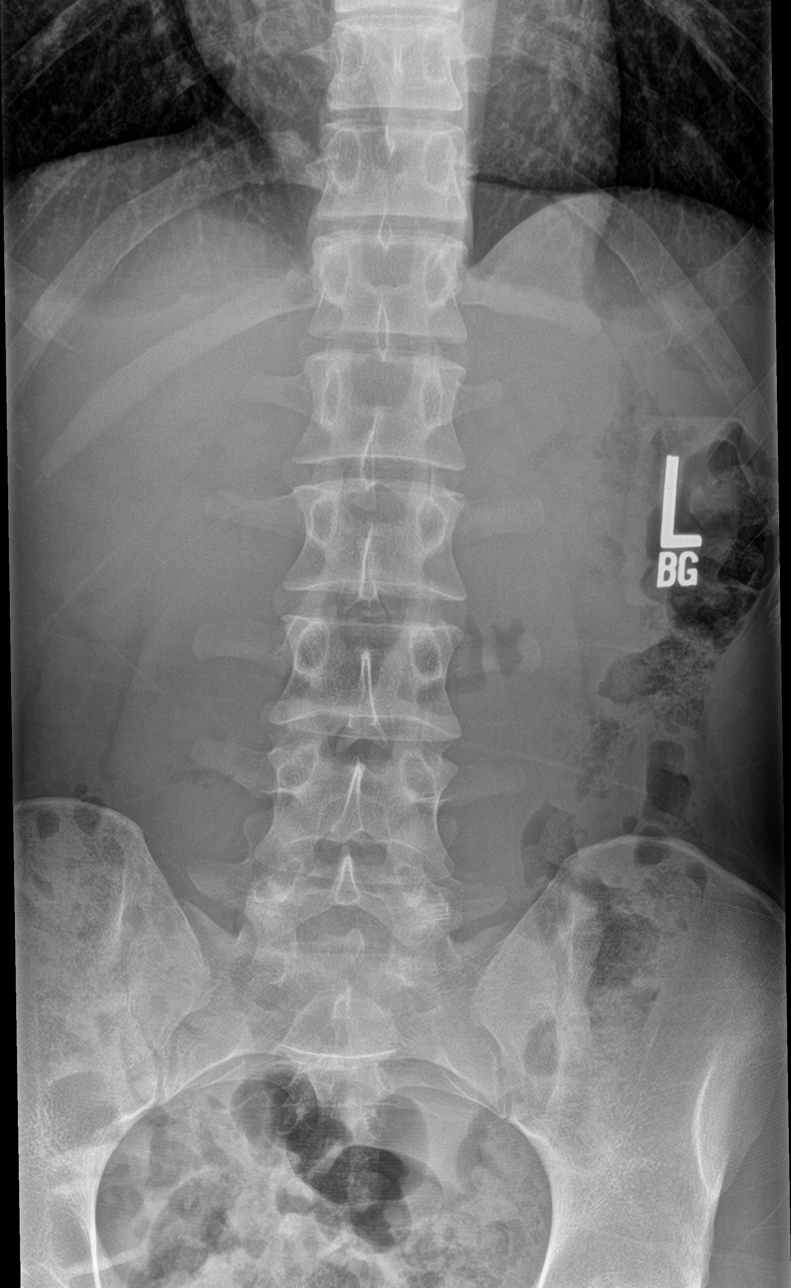

[l-spine obl (1 of 2)]
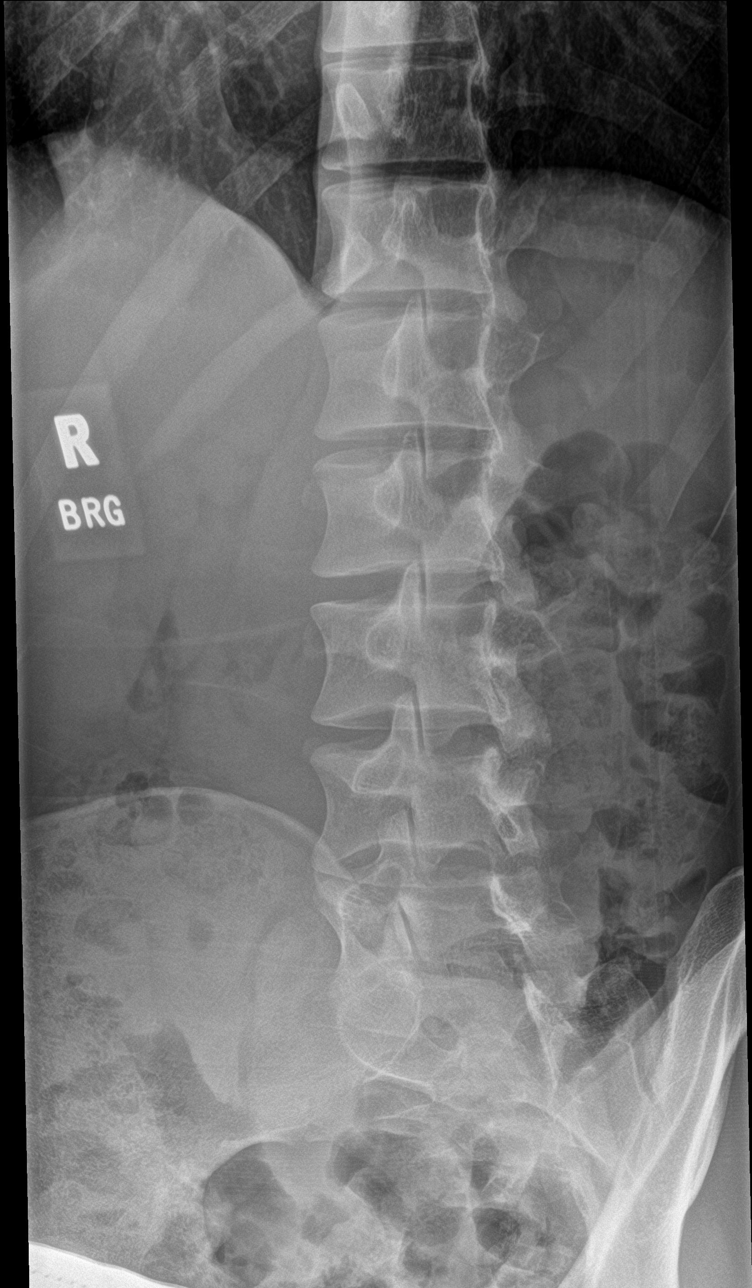

[l-spine obl (2 of 2)]
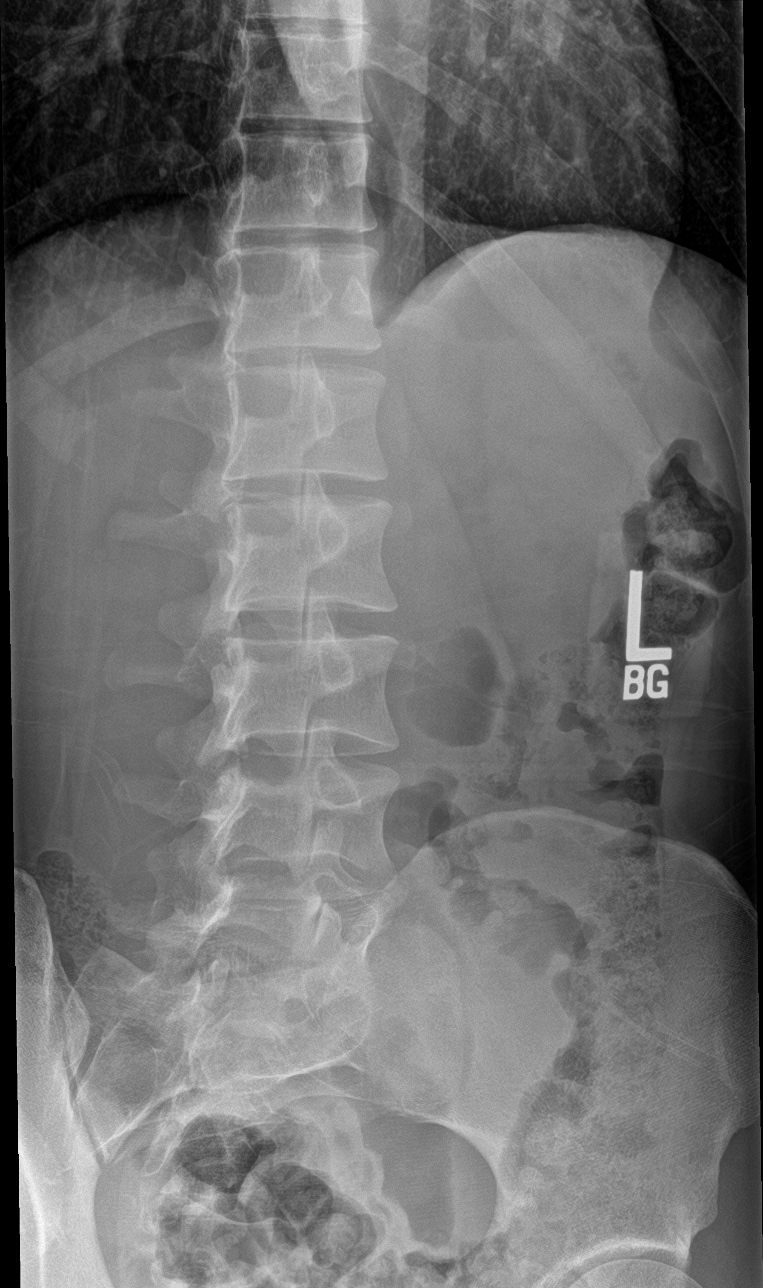

[l-spine lat]
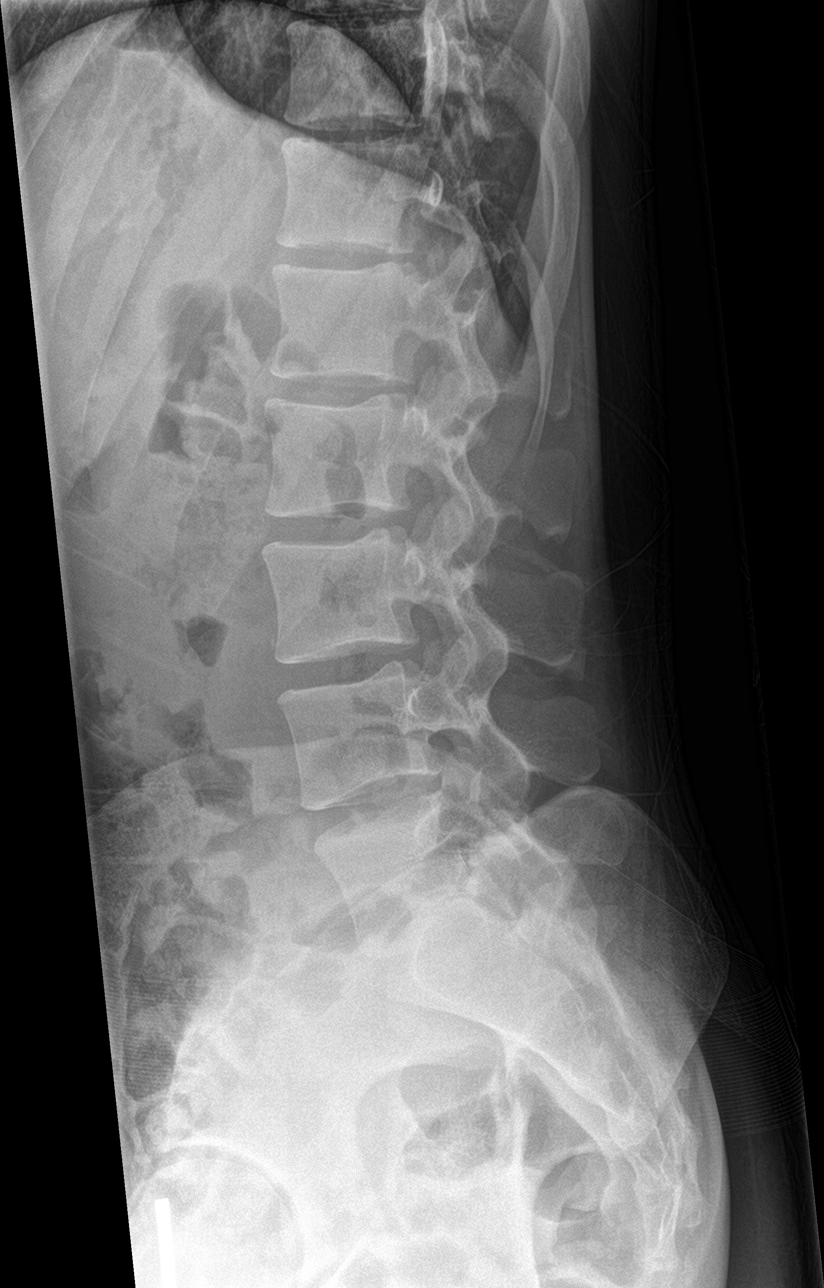

[l-spine spot]
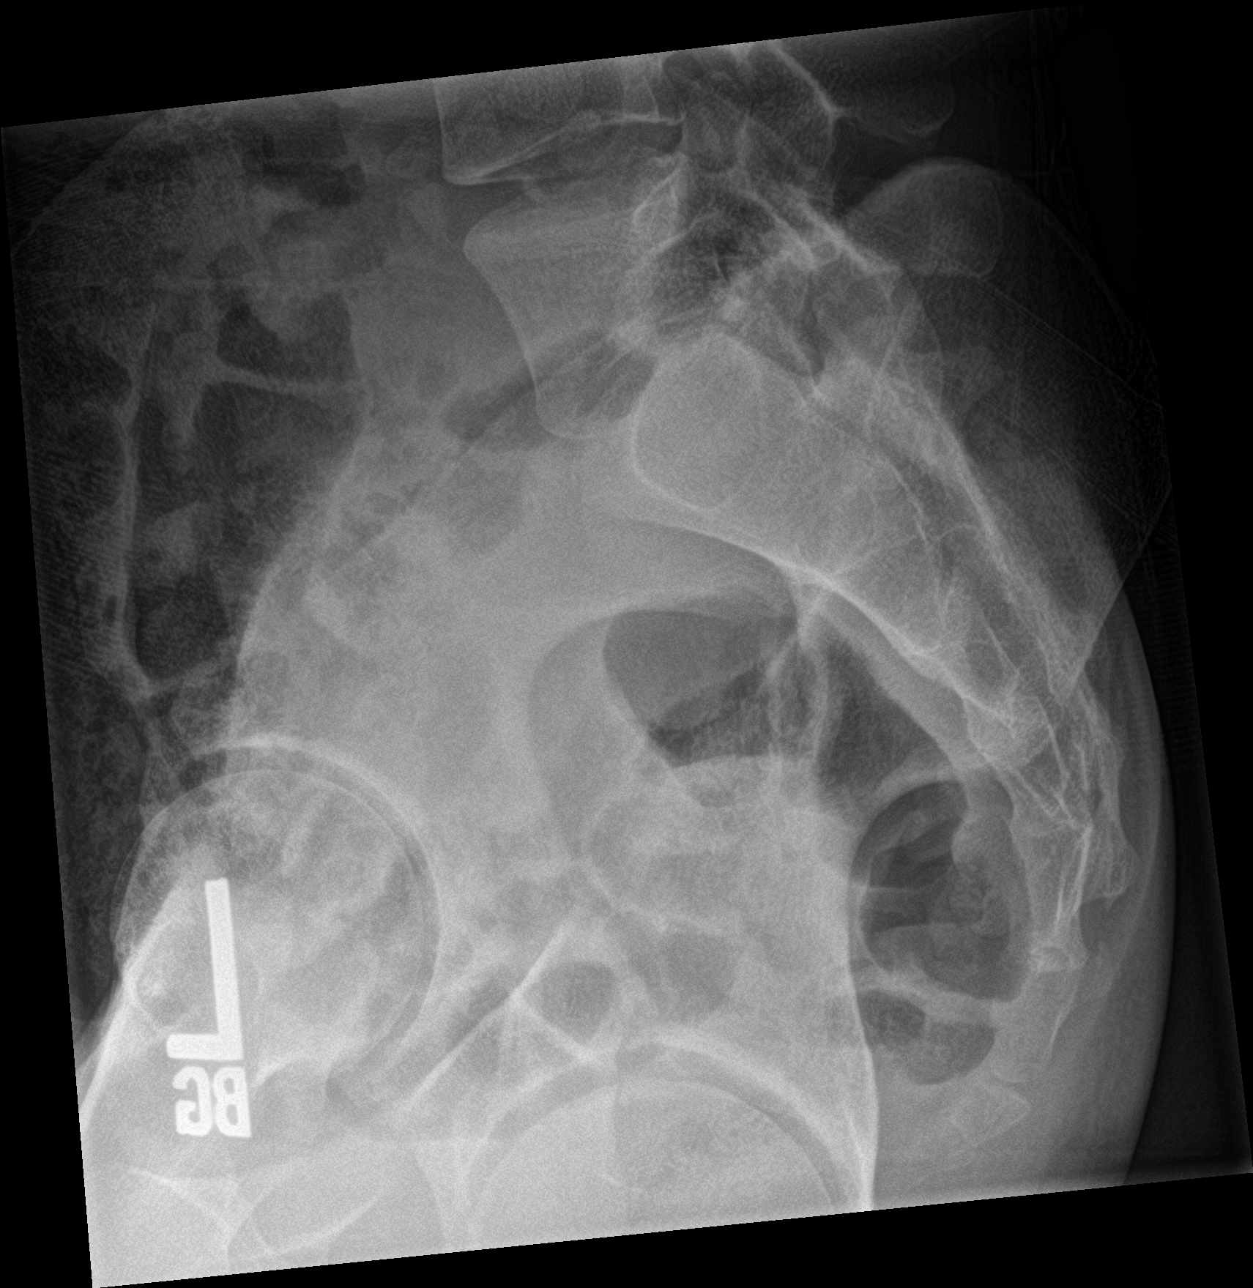

[5 of 5 positions shown; findings below may reference images not displayed]

FINDINGS: There are 5 non rib-bearing lumbar type vertebral bodies with a
slightly diminutive left-sided rib seen at T12.

Mild straightening expected lumbar lordosis. No anterolisthesis or
retrolisthesis. No definitive pars defects. No significant scoliotic
curvature.

Lumbar vertebral body heights appear preserved.

Lumbar intervertebral disc space heights appear preserved.

Limited visualization of the bilateral SI joints is normal.

Moderate colonic stool burden without evidence of enteric
obstruction.
IMPRESSION: 1. Straightening expected lumbar lordosis, nonspecific though could
be seen in the setting of muscle spasm.
2. Otherwise, no explanation for patient's back pain. Specifically,
no significant scoliotic curvature.

## 2023-06-18 NOTE — Progress Notes (Unsigned)
Virtual Visit via Video Note  I connected with Joshua Gilmore on 06/22/23 at  1:40 PM EST by a video enabled telemedicine application and verified that I am speaking with the correct person using two identifiers.  Location: Patient: home Provider: office Persons participated in the visit- patient, provider    I discussed the limitations of evaluation and management by telemedicine and the availability of in person appointments. The patient expressed understanding and agreed to proceed.    I discussed the assessment and treatment plan with the patient. The patient was provided an opportunity to ask questions and all were answered. The patient agreed with the plan and demonstrated an understanding of the instructions.   The patient was advised to call back or seek an in-person evaluation if the symptoms worsen or if the condition fails to improve as anticipated.  I provided 15 minutes of non-face-to-face time during this encounter.   Joshua Hotter, MD    [facility] MD/PA/NP OP Progress Note  06/22/2023 2:05 PM Joshua Gilmore  MRN:  [id number]  Chief Complaint:  Chief Complaint  Patient presents with   Follow-up   HPI:  This is a follow-up appointment for adjustment disorder with depressed mood, ADHD.  He gives very short answers, often requiring to ask for more detail or clarification. He states that he has been doing cleaning from 4 pm through midnight.  He has been playing video games. His mood has been the same. When he is asked to elaborate, he states that he has some virus, and is not feeling good.  He does not feel depressed or anxious.  He sleeps 3 am- 10 am.  Although he does not go outside for physical activity, he does not think it is a problem.  He denies SI, HI.  He denies hallucinations.  He was playing video games during the latter half of the visit.   His mother, Joshua Gilmore presents to the visit.  She states that he has been doing much better.  He is now at new position,  dishwasher.  He goes to work regularly.  He plays video game after he wakes up and do he goes to work, although this is his usual.  She denies any concern about his mood.  She makes sure that he takes medication regularly.   Joshua Gilmore, his mother presents to the visit.  She thinks he has been doing better since working at the current place.  He is not moody, and wakes up when he needs to.  He takes medication regularly.  She denies any concern at this time.   Substance use   Tobacco Alcohol Other substances/  Current   denies denies  Past   Only once in 2023 denies  Past Treatment           Visit Diagnosis:    ICD-10-CM   1. Adjustment disorder with mixed anxiety and depressed mood  F43.23     2. Attention deficit hyperactivity disorder (ADHD), combined type  F90.2       Past Psychiatric History: Please see initial evaluation for full details. I have reviewed the history. No updates at this time.     Past Medical History:  Past Medical History:  Diagnosis Date   ADHD (attention deficit hyperactivity disorder)    Allergic rhinitis    Autism spectrum disorder    Generalized anxiety disorder 05/07/2013   No past surgical history on file.  Family Psychiatric History: Please see initial evaluation for full details. I have reviewed the history.  No updates at this time.     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  Tobacco Use   Smoking status: Never   Smokeless tobacco: Never  Vaping Use   Vaping status: Never Used   Substance and Sexual Activity   Alcohol use: Never   Drug use: Never   Sexual activity: Not Currently  Other Topics Concern   Not on file  Social History Narrative   Lives with mom,sister away at college   Social Determinants of Health   Financial Resource Strain: Low Risk  (10/18/2022)   Overall Financial Resource Strain (CARDIA)    Difficulty of Paying Living Expenses: Not hard at all  Food Insecurity: No Food Insecurity (10/18/2022)   Hunger Vital Sign    Worried About Running Out of Food in the Last Year: Never true    Ran Out of Food in the Last Year: Never true  Transportation Needs: No Transportation Needs (10/18/2022)   PRAPARE - Administrator, Civil Service (Medical): No    Lack of Transportation (Non-Medical): No  Physical Activity: Unknown (10/18/2022)   Exercise Vital Sign    Days of Exercise per Week: 0 days    Minutes of Exercise per Session: Not on file  Stress: Stress Concern Present (10/18/2022)   [employer] of Occupational Health - Occupational Stress Questionnaire    Feeling of Stress : To some extent  Social Connections: Unknown (10/18/2022)   Social Connection and Isolation Panel [NHANES]    Frequency of Communication with Friends and Family: More than three times a week    Frequency of Social Gatherings with Friends and Family: Once a week    Attends Religious Services: 1 to 4 times per year    Active Member of Clubs or Organizations: No    Attends Engineer, structural: Not on file    Marital Status: Patient declined    Allergies: No Known Allergies  Metabolic Disorder Labs: No results found for: "HGBA1C", "MPG" No results found for: "PROLACTIN" Lab Results  Component Value Date   CHOL 160 05/02/2020   TRIG 52 05/02/2020   HDL 62 05/02/2020   CHOLHDL 2.6 05/02/2020   LDLCALC 87 05/02/2020   Lab Results  Component Value Date   TSH 0.865 07/29/2021   TSH 1.100 05/02/2020    Therapeutic Level Labs: No results found for:  "LITHIUM" No results found for: "VALPROATE" No results found for: "CBMZ"  Current Medications: Current Outpatient Medications  Medication Sig Dispense Refill   [START ON 07/14/2023] buPROPion (WELLBUTRIN XL) 150 MG 24 hr tablet Take 1 tablet (150 mg total) by mouth daily. 30 tablet 1   cyclobenzaprine (FLEXERIL) 5 MG tablet Take 1 tablet (5 mg total) by mouth 2 (two) times daily as needed for muscle spasms. 30 tablet 0   fluticasone (FLONASE) 50 MCG/ACT nasal spray Place 1 spray into both nostrils 2 (two) times daily. 16 g 2   ibuprofen (ADVIL)  800 MG tablet Take 1 tablet (800 mg total) by mouth every 8 (eight) hours as needed. 30 tablet 0   neomycin-polymyxin-hydrocortisone (CORTISPORIN) 3.5-10000-1 OTIC suspension Place 4 drops into both ears 4 (four) times daily. 10 mL 0   promethazine-dextromethorphan (PROMETHAZINE-DM) 6.25-15 MG/5ML syrup Take 5 mLs by mouth 4 (four) times daily as needed. 100 mL 0   No current facility-administered medications for this visit.     Musculoskeletal: Strength & Muscle Tone:  N/A Gait & Station:  N/A Patient leans: N/A  Psychiatric Specialty Exam: Review of Systems  Psychiatric/Behavioral:  Negative for agitation, behavioral problems, confusion, decreased concentration, dysphoric mood, hallucinations, self-injury, sleep disturbance and suicidal ideas. The patient is not nervous/anxious and is not hyperactive.   All other systems reviewed and are negative.   There were no vitals taken for this visit.There is no height or weight on file to calculate BMI.  General Appearance: Well Groomed  Eye Contact:  Good  Speech:  Clear and Coherent  Volume:  Normal  Mood:   not good  Affect:   slightly restricted  Thought Process:  Coherent  Orientation:  Full (Time, Place, and Person)  Thought Content: Logical   Suicidal Thoughts:  No  Homicidal Thoughts:  No  Memory:  Immediate;   Good  Judgement:  Good  Insight:  Present  Psychomotor Activity:  Normal   Concentration:  Concentration: Good and Attention Span: Good  Recall:  Good  Fund of Knowledge: Good  Language: Good  Akathisia:  No  Handed:  Right  AIMS (if indicated): not done  Assets:  Communication Skills Desire for Improvement  ADL's:  Intact  Cognition: WNL  Sleep:  Good   Screenings: GAD-7    Flowsheet Row Office Visit from 02/14/2023 in [facility] Office Visit from 12/29/2022 in [facility] Counselor from 11/29/2022 in [facility] at [place] Office Visit from 05/13/2022 in [facility] Office Visit from 02/15/2022 in [facility]  Total GAD-7 Score 3 1 8 2  0      PHQ2-9    Flowsheet Row Office Visit from 04/28/2023 in [facility] Office Visit from 02/14/2023 in [facility] Office Visit from 01/24/2023 in [facility] Office Visit from 12/29/2022 in [facility] Primary Care Counselor from 11/29/2022 in [facility] Total Score 0 0 0 0 1  PHQ-9 Total Score -- -- -- 2 8      Flowsheet Row ED from 06/08/2023 in [facility] at [facility] from 11/29/2022 in [facility] at [place] ED from 09/11/2022 in [facility]  C-SSRS RISK CATEGORY No Risk No Risk No Risk        Assessment and Plan:  Jashad Ganus is a 22 y.o. year old male with a history of autism, ADHD, mild intellectual developmental disorder, who presents for follow up appointment for below.   1. Adjustment disorder with mixed anxiety and depressed mood 2. Attention deficit hyperactivity disorder (ADHD), combined type Acute stressors include: unemployment Other stressors include: loneliness   History: non adherent to  medication,   received neuropsych testing at [facility] 10/2019   Exam is notable for less engagement during the visit, and he was playing video game at the end of the visit after this writer was talking was  his mother.  However, his mother states that he has been doing well while she acknowledges him playing video games, which has been going at his baseline.  Will continue current dose of bupropion to target depression.  He will continue to see Mr. Montez Morita for therapy.    3. Autism spectrum disorder 3. Intellectual developmental disorder, mild - received neuropsych testing at [facility] During visits since initial evaluation, he has not displayed symptoms to be concerned of psychotic disorder except that he has paranoia/anxiety and some tangential thought process on initial visit, which be more attributable to anxiety. No known history of schizophrenia, and no safety concern of aggression reported by his caregiver.      Plan Continue bupropion 150 mg daily  Next appointment: 1/29 at 1:40, video   The patient demonstrates the following risk factors for suicide: Chronic risk factors for suicide include: psychiatric disorder of autism, ADHD . Acute risk factors for suicide include: social withdrawal/isolation. Protective factors for this patient include: positive social support. Considering these factors, the overall suicide risk at this point appears to be low. Patient is appropriate for outpatient follow up.     Collaboration of Care: Collaboration of Care: Other reviewed notes in Epic  Patient/Guardian was advised Release of Information must be obtained prior to any record release in order to collaborate their care with an outside provider. Patient/Guardian was advised if they have not already done so to contact the registration department to sign all necessary forms in order for [place] to release information regarding their care.   Consent: Patient/Guardian gives verbal consent for treatment and  assignment of benefits for services provided during this visit. Patient/Guardian expressed understanding and agreed to proceed.    Joshua Hotter, MD 06/22/2023, 2:05 PM

## 2023-06-22 ENCOUNTER — Telehealth (INDEPENDENT_AMBULATORY_CARE_PROVIDER_SITE_OTHER): Payer: MEDICAID | Admitting: Psychiatry

## 2023-06-22 ENCOUNTER — Encounter: Payer: Self-pay | Admitting: Psychiatry

## 2023-06-22 DIAGNOSIS — F902 Attention-deficit hyperactivity disorder, combined type: Secondary | ICD-10-CM

## 2023-06-22 DIAGNOSIS — F4323 Adjustment disorder with mixed anxiety and depressed mood: Secondary | ICD-10-CM | POA: Diagnosis not present

## 2023-06-22 MED ORDER — BUPROPION HCL ER (XL) 150 MG PO TB24: 150.0000 mg | ORAL_TABLET | Freq: Every day | ORAL | 1 refills | Status: DC

## 2023-08-15 ENCOUNTER — Encounter: Payer: MEDICAID | Admitting: Internal Medicine

## 2023-08-16 NOTE — Progress Notes (Unsigned)
Virtual Visit via Video Note  I connected with Joshua Gilmore on 08/17/23 at  1:40 PM EST by a video enabled telemedicine application and verified that I am speaking with the correct person using two identifiers.  Location: Patient: home Provider: office Persons participated in the visit- patient, provider    I discussed the limitations of evaluation and management by telemedicine and the availability of in person appointments. The patient expressed understanding and agreed to proceed.    I discussed the assessment and treatment plan with the patient. The patient was provided an opportunity to ask questions and all were answered. The patient agreed with the plan and demonstrated an understanding of the instructions.   The patient was advised to call back or seek an in-person evaluation if the symptoms worsen or if the condition fails to improve as anticipated.  I provided 15 minutes of non-face-to-face time during this encounter.   Neysa Hotter, MD    [facility] MD/PA/NP OP Progress Note  08/17/2023 1:59 PM Joshua Gilmore  MRN:  [id number]  Chief Complaint:  Chief Complaint  Patient presents with   Follow-up   HPI:  This is a follow-up appointment for depression.  He states that he has been doing well.  He is concerned about the shift change at work.  He enjoys going to [employer] with his mother.  Otherwise, he tends to spend time, playing video games.  He has not eaten breakfast, and he is trying to limit hours of playing game.  He states that he was able to do it previously.  He denies feeling depressed or anxiety.  He sleeps well.  He denies SI.  He feels comfortable to stay on the current dose of the medication.   Substance use   Tobacco Alcohol Other substances/  Current   denies denies  Past   Only once in 2023- did not like the taste denies  Past Treatment           Visit Diagnosis:    ICD-10-CM   1. Adjustment disorder with mixed anxiety and depressed mood  F43.23      2. Attention deficit hyperactivity disorder (ADHD), combined type  F90.2       Past Psychiatric History: Please see initial evaluation for full details. I have reviewed the history. No updates at this time.     Past Medical History:  Past Medical History:  Diagnosis Date   ADHD (attention deficit hyperactivity disorder)    Allergic rhinitis    Autism spectrum disorder    Generalized anxiety disorder 05/07/2013   History reviewed. No pertinent surgical history.  Family Psychiatric History: Please see initial evaluation for full details. I have reviewed the history. No updates at this time.     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  Tobacco Use   Smoking status: Never   Smokeless tobacco: Never  Vaping Use   Vaping status: Never Used  Substance and Sexual Activity  Alcohol use: Never   Drug use: Never   Sexual activity: Not Currently  Other Topics Concern   Not on file  Social History Narrative   Lives with mom,sister away at college   Social Drivers of Health   Financial Resource Strain: Low Risk  (10/18/2022)   Overall Financial Resource Strain (CARDIA)    Difficulty of Paying Living Expenses: Not hard at all  Food Insecurity: No Food Insecurity (10/18/2022)   Hunger Vital Sign    Worried About Running Out of Food in the Last Year: Never true    Ran Out of Food in the Last Year: Never true  Transportation Needs: No Transportation Needs (10/18/2022)   PRAPARE - Administrator, Civil Service  (Medical): No    Lack of Transportation (Non-Medical): No  Physical Activity: Unknown (10/18/2022)   Exercise Vital Sign    Days of Exercise per Week: 0 days    Minutes of Exercise per Session: Not on file  Stress: Stress Concern Present (10/18/2022)   [employer] of Occupational Health - Occupational Stress Questionnaire    Feeling of Stress : To some extent  Social Connections: Unknown (10/18/2022)   Social Connection and Isolation Panel [NHANES]    Frequency of Communication with Friends and Family: More than three times a week    Frequency of Social Gatherings with Friends and Family: Once a week    Attends Religious Services: 1 to 4 times per year    Active Member of Clubs or Organizations: No    Attends Engineer, structural: Not on file    Marital Status: Patient declined    Allergies: No Known Allergies  Metabolic Disorder Labs: No results found for: "HGBA1C", "MPG" No results found for: "PROLACTIN" Lab Results  Component Value Date   CHOL 160 05/02/2020   TRIG 52 05/02/2020   HDL 62 05/02/2020   CHOLHDL 2.6 05/02/2020   LDLCALC 87 05/02/2020   Lab Results  Component Value Date   TSH 0.865 07/29/2021   TSH 1.100 05/02/2020    Therapeutic Level Labs: No results found for: "LITHIUM" No results found for: "VALPROATE" No results found for: "CBMZ"  Current Medications: Current Outpatient Medications  Medication Sig Dispense Refill   [START ON 09/12/2023] buPROPion (WELLBUTRIN XL) 150 MG 24 hr tablet Take 1 tablet (150 mg total) by mouth daily. 30 tablet 2   cyclobenzaprine (FLEXERIL) 5 MG tablet Take 1 tablet (5 mg total) by mouth 2 (two) times daily as needed for muscle spasms. 30 tablet 0   fluticasone (FLONASE) 50 MCG/ACT nasal spray Place 1 spray into both nostrils 2 (two) times daily. 16 g 2   ibuprofen (ADVIL) 800 MG tablet Take 1 tablet (800 mg total) by mouth every 8 (eight) hours as needed. 30 tablet 0   neomycin-polymyxin-hydrocortisone  (CORTISPORIN) 3.5-10000-1 OTIC suspension Place 4 drops into both ears 4 (four) times daily. 10 mL 0   promethazine-dextromethorphan (PROMETHAZINE-DM) 6.25-15 MG/5ML syrup Take 5 mLs by mouth 4 (four) times daily as needed. 100 mL 0   No current facility-administered medications for this visit.     Musculoskeletal: Strength & Muscle Tone:  N/A Gait & Station:  N/A Patient leans: N/A  Psychiatric Specialty Exam: Review of Systems  Psychiatric/Behavioral: Negative.    All other systems reviewed and are negative.   There were no vitals taken for this visit.There is no height or weight on file to calculate BMI.  General Appearance: Well Groomed  Eye Contact:  Good  Speech:  Clear and Coherent  Volume:  Normal  Mood:   good  Affect:  Appropriate, Congruent, and Full Range  Thought Process:  Coherent  Orientation:  Full (Time, Place, and Person)  Thought Content: Logical   Suicidal Thoughts:  No  Homicidal Thoughts:  No  Memory:  Immediate;   Good  Judgement:  Good  Insight:  Good  Psychomotor Activity:  Normal  Concentration:  Concentration: Good and Attention Span: Good  Recall:  Good  Fund of Knowledge: Good  Language: Good  Akathisia:  No  Handed:  Left  AIMS (if indicated): not done  Assets:  Communication Skills Desire for Improvement  ADL's:  Intact  Cognition: WNL  Sleep:  Good   Screenings: GAD-7    Flowsheet Row Office Visit from 02/14/2023 in [facility] Office Visit from 12/29/2022 in [facility] from 11/29/2022 in [facility] [place] Office Visit from 05/13/2022 in [facility] Office Visit from 02/15/2022 in [facility]  Total GAD-7 Score 3 1 8 2  0      PHQ2-9    Flowsheet Row Office Visit from 04/28/2023 in [facility] Office Visit from  02/14/2023 in [facility] Office Visit from 01/24/2023 in [facility] Office Visit from 12/29/2022 in [facility] from 11/29/2022 in [facility] Total Score 0 0 0 0 1  PHQ-9 Total Score -- -- -- 2 8      Flowsheet Row ED from 06/08/2023 in [facility] from 11/29/2022 in [facility] [place] ED from 09/11/2022 in [facility]  C-SSRS RISK CATEGORY No Risk No Risk No Risk        Assessment and Plan:  Orion Mole is a 23 y.o. year old male with a history of autism, ADHD, mild intellectual developmental disorder, who presents for follow up appointment for below.   1. Adjustment disorder with mixed anxiety and depressed mood 2. Attention deficit hyperactivity disorder (ADHD), combined type Acute stressors include: unemployment Other stressors include: loneliness   History: non adherent to medication,   received neuropsych testing at [facility] 10/2019    He denies any significant mood symptoms on today's evaluation.  Will continue current dose of bupropion to target depression, ADHD symptoms.   3. Autism spectrum disorder 3. Intellectual developmental disorder, mild - received neuropsych testing at [facility] During visits since initial evaluation, he has not displayed symptoms to be concerned of psychotic disorder except that he has paranoia/anxiety and some tangential thought process on initial visit, which be more attributable to anxiety. No known history of schizophrenia, and no safety concern of aggression reported by his caregiver.      Plan Continue bupropion 150 mg daily  Next appointment: 4/30 at 1 20, video   The patient demonstrates the following risk factors for suicide: Chronic risk factors for suicide include: psychiatric disorder of  autism, ADHD . Acute risk factors for suicide include: social withdrawal/isolation. Protective factors for this patient include: positive social support. Considering these factors, the overall suicide risk at this point appears to be low. Patient is appropriate for outpatient follow up.     Collaboration of Care: Collaboration of Care: Other reviewed notes in Epic  Patient/Guardian was advised Release of  Information must be obtained prior to any record release in order to collaborate their care with an outside provider. Patient/Guardian was advised if they have not already done so to contact the registration department to sign all necessary forms in order for [place] to release information regarding their care.   Consent: Patient/Guardian gives verbal consent for treatment and assignment of benefits for services provided during this visit. Patient/Guardian expressed understanding and agreed to proceed.    Neysa Hotter, MD 08/17/2023, 1:59 PM

## 2023-08-17 ENCOUNTER — Encounter: Payer: Self-pay | Admitting: Psychiatry

## 2023-08-17 ENCOUNTER — Telehealth (INDEPENDENT_AMBULATORY_CARE_PROVIDER_SITE_OTHER): Payer: MEDICAID | Admitting: Psychiatry

## 2023-08-17 DIAGNOSIS — F902 Attention-deficit hyperactivity disorder, combined type: Secondary | ICD-10-CM

## 2023-08-17 DIAGNOSIS — F4323 Adjustment disorder with mixed anxiety and depressed mood: Secondary | ICD-10-CM

## 2023-08-17 MED ORDER — BUPROPION HCL ER (XL) 150 MG PO TB24: 150.0000 mg | ORAL_TABLET | Freq: Every day | ORAL | 2 refills | Status: DC

## 2023-08-17 NOTE — Patient Instructions (Signed)
Continue bupropion 150 mg daily  Next appointment: 4/30 at 1 20

## 2023-08-25 ENCOUNTER — Ambulatory Visit (INDEPENDENT_AMBULATORY_CARE_PROVIDER_SITE_OTHER): Payer: BC Managed Care – PPO | Admitting: Family Medicine

## 2023-08-25 VITALS — BP 133/79 | HR 72 | Resp 16 | Ht 72.0 in | Wt 141.0 lb

## 2023-08-25 DIAGNOSIS — E038 Other specified hypothyroidism: Secondary | ICD-10-CM | POA: Diagnosis not present

## 2023-08-25 DIAGNOSIS — E559 Vitamin D deficiency, unspecified: Secondary | ICD-10-CM

## 2023-08-25 DIAGNOSIS — Z0001 Encounter for general adult medical examination with abnormal findings: Secondary | ICD-10-CM | POA: Diagnosis not present

## 2023-08-25 DIAGNOSIS — R7301 Impaired fasting glucose: Secondary | ICD-10-CM | POA: Diagnosis not present

## 2023-08-25 DIAGNOSIS — E7849 Other hyperlipidemia: Secondary | ICD-10-CM

## 2023-08-25 NOTE — Assessment & Plan Note (Signed)

## 2023-08-25 NOTE — Patient Instructions (Addendum)
I appreciate the opportunity to provide care to you today!    Follow up:  6 months  Labs: please stop by the lab during the week to get your blood drawn (CBC, CMP, TSH, Lipid profile, HgA1c, Vit D)  Attached with your AVS, you will find valuable resources for self-education. I highly recommend dedicating some time to thoroughly examine them.   Please continue to a heart-healthy diet and increase your physical activities. Try to exercise for at least five days a week.    It was a pleasure to see you and I look forward to continuing to work together on your health and well-being. Please do not hesitate to call the office if you need care or have questions about your care.  In case of emergency, please visit the Emergency Department for urgent care, or contact our clinic at [phone] to schedule an appointment. We're here to help you!   Have a wonderful day and week. With Gratitude, Gilmore Laroche MSN, FNP-BC

## 2023-08-25 NOTE — Progress Notes (Signed)
 Complete physical exam  Patient: Joshua Gilmore   DOB: 2000-10-29   22 y.o. Male  MRN: [id number]  Subjective:    Chief Complaint  Patient presents with   Annual Exam    Dallyn Bergland is a 23 y.o. male who presents today for a complete physical exam. He reports consuming a general diet. The patient does not participate in regular exercise at present. He generally feels well. He reports sleeping well. He does not have additional problems to discuss today.    Most recent fall risk assessment:    08/25/2023    2:33 PM  Fall Risk   Falls in the past year? 1  Number falls in past yr: 0  Injury with Fall? 0     Most recent depression screenings:    08/25/2023    2:33 PM 04/28/2023   10:35 AM  PHQ 2/9 Scores  PHQ - 2 Score 0 0    Dental: No current dental problems and No regular dental care   Patient Active Problem List   Diagnosis Date Noted   Panic disorder 01/28/2023   Acute pain of right knee 10/20/2022   Acute low back pain without sciatica 10/20/2022   Difficulty sleeping 09/11/2022   Depression with anxiety 01/27/2022   Left chronic serous otitis media 08/26/2021   Encounter for general adult medical examination with abnormal findings 07/29/2021   Seasonal allergies 12/10/2020   Alopecia 04/23/2020   Insomnia 04/23/2020   ADHD 04/23/2020   Autism spectrum disorder    Past Medical History:  Diagnosis Date   ADHD (attention deficit hyperactivity disorder)    Allergic rhinitis    Autism spectrum disorder    Generalized anxiety disorder 05/07/2013   No past surgical history on file. 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  Tobacco Use   Smoking status: Never   Smokeless tobacco: Never  Vaping Use   Vaping status: Never Used  Substance and Sexual Activity   Alcohol use: Never   Drug use: Never   Sexual activity: Not Currently  Other Topics Concern   Not on file  Social History Narrative   Lives with mom,sister away at college   Social Drivers of Health   Financial Resource Strain: Low Risk  (08/25/2023)   Overall Financial Resource Strain (CARDIA)    Difficulty of Paying Living Expenses: Not hard at all  Food Insecurity: No Food Insecurity (08/25/2023)   Hunger Vital Sign    Worried About Running Out of Food in the Last Year: Never true    Ran Out of Food in the Last Year: Never true  Transportation Needs: No Transportation Needs (08/25/2023)   PRAPARE - Administrator, Civil Service (Medical): No    Lack of Transportation (Non-Medical): No  Physical Activity: Unknown (08/25/2023)   Exercise Vital Sign    Days of Exercise per Week: 0 days    Minutes of Exercise per Session: Not on file  Stress: No Stress Concern Present (08/25/2023)  [employer] of Occupational Health - Occupational Stress Questionnaire    Feeling of Stress : Only a little  Social Connections: Moderately Isolated (08/25/2023)   Social Connection and Isolation Panel [NHANES]    Frequency of Communication with Friends and Family: Once a week    Frequency of Social Gatherings with Friends and Family: Once a week    Attends Religious Services: 1 to 4 times per year    Active Member of [employer] or Organizations: Yes    Attends Banker Meetings: 1 to 4 times per year    Marital Status: Never married  Intimate Partner Violence: Not At Risk (04/23/2020)   Humiliation, Afraid, Rape, and Kick questionnaire    Fear of Current or Ex-Partner: No    Emotionally Abused: No    Physically Abused: No    Sexually Abused: No   Family Status   Relation Name Status   Mother  Alive   Father  Alive   Sister  Alive   Mat Uncle  (Not Specified)   Bruna Brigham  (Not Specified)   Cousin  (Not Specified)  No partnership data on file   Family History  Problem Relation Age of Onset   Hypertension Mother    Learning disabilities Sister    Alcohol abuse Maternal Uncle    Alcohol abuse Paternal Uncle    Autism spectrum disorder Cousin    No Known Allergies    Patient Care Team: Tobie Suzzane POUR, MD as PCP - General (Internal Medicine)   Outpatient Medications Prior to Visit  Medication Sig   [START ON 09/12/2023] buPROPion  (WELLBUTRIN  XL) 150 MG 24 hr tablet Take 1 tablet (150 mg total) by mouth daily.   cyclobenzaprine  (FLEXERIL ) 5 MG tablet Take 1 tablet (5 mg total) by mouth 2 (two) times daily as needed for muscle spasms.   fluticasone  (FLONASE ) 50 MCG/ACT nasal spray Place 1 spray into both nostrils 2 (two) times daily.   ibuprofen  (ADVIL ) 800 MG tablet Take 1 tablet (800 mg total) by mouth every 8 (eight) hours as needed.   [DISCONTINUED] neomycin -polymyxin-hydrocortisone  (CORTISPORIN) 3.5-10000-1 OTIC suspension Place 4 drops into both ears 4 (four) times daily.   [DISCONTINUED] promethazine -dextromethorphan (PROMETHAZINE -DM) 6.25-15 MG/5ML syrup Take 5 mLs by mouth 4 (four) times daily as needed.   No facility-administered medications prior to visit.    Review of Systems  Constitutional:  Negative for chills, fever and malaise/fatigue.  HENT:  Negative for congestion and sinus pain.   Eyes:  Negative for pain, discharge and redness.  Respiratory:  Negative for cough, sputum production and shortness of breath.   Cardiovascular:  Negative for chest pain, palpitations, claudication and leg swelling.  Gastrointestinal:  Negative for diarrhea, heartburn and nausea.  Genitourinary:  Negative for flank pain and frequency.  Musculoskeletal:  Negative for back pain and joint pain.  Skin:  Negative for itching.  Neurological:   Negative for dizziness, seizures and headaches.  Endo/Heme/Allergies:  Negative for environmental allergies.  Psychiatric/Behavioral:  Negative for memory loss. The patient does not have insomnia.        Objective:    BP 133/79   Pulse 72   Resp 16   Ht 6' (1.829 m)   Wt 141 lb (64 kg)   SpO2 97%   BMI 19.12 kg/m  BP Readings from Last 3 Encounters:  08/25/23 133/79  06/08/23 128/83  04/28/23 113/73   Wt Readings from Last 3 Encounters:  08/25/23 141 lb (64 kg)  04/28/23 141 lb (64  kg)  01/24/23 140 lb 12.8 oz (63.9 kg)      Physical Exam HENT:     Head: Normocephalic.     Right Ear: External ear normal.     Left Ear: External ear normal.     Nose: No congestion.     Mouth/Throat:     Mouth: Mucous membranes are moist.  Eyes:     Extraocular Movements: Extraocular movements intact.     Pupils: Pupils are equal, round, and reactive to light.  Cardiovascular:     Rate and Rhythm: Regular rhythm. Bradycardia present.     Heart sounds: No murmur heard. Pulmonary:     Effort: No respiratory distress.     Breath sounds: Normal breath sounds.  Abdominal:     Tenderness: There is no right CVA tenderness or left CVA tenderness.  Musculoskeletal:     Right lower leg: No edema.     Left lower leg: No edema.  Neurological:     Mental Status: He is alert and oriented to person, place, and time.     GCS: GCS eye subscore is 4. GCS verbal subscore is 5. GCS motor subscore is 6.     Cranial Nerves: No facial asymmetry.     Motor: No atrophy.     Coordination: Coordination normal. Finger-Nose-Finger Test normal.     Gait: Gait normal.  Psychiatric:        Judgment: Judgment normal.     No results found for any visits on 08/25/23. Last CBC Lab Results  Component Value Date   WBC 3.4 07/29/2021   HGB 15.2 07/29/2021   HCT 47.0 07/29/2021   MCV 82 07/29/2021   MCH 26.4 (L) 07/29/2021   RDW 13.5 07/29/2021   PLT 312 07/29/2021   Last metabolic panel Lab Results   Component Value Date   GLUCOSE 80 07/29/2021   NA 140 07/29/2021   K 4.3 07/29/2021   CL 99 07/29/2021   CO2 25 07/29/2021   BUN 12 07/29/2021   CREATININE 1.05 07/29/2021   EGFR 104 07/29/2021   CALCIUM 10.2 07/29/2021   PROT 7.8 07/29/2021   ALBUMIN 5.4 (H) 07/29/2021   LABGLOB 2.4 07/29/2021   AGRATIO 2.3 (H) 07/29/2021   BILITOT 2.1 (H) 07/29/2021   ALKPHOS 72 07/29/2021   AST 20 07/29/2021   ALT 14 07/29/2021   Last lipids Lab Results  Component Value Date   CHOL 160 05/02/2020   HDL 62 05/02/2020   LDLCALC 87 05/02/2020   TRIG 52 05/02/2020   CHOLHDL 2.6 05/02/2020   Last hemoglobin A1c No results found for: HGBA1C Last thyroid functions Lab Results  Component Value Date   TSH 0.865 07/29/2021   T4TOTAL 6.9 05/02/2020   Last vitamin D  Lab Results  Component Value Date   VD25OH 36.3 07/29/2021   Last vitamin B12 and Folate No results found for: VITAMINB12, FOLATE      Assessment & Plan:    Routine Health Maintenance and Physical Exam  Immunization History  Administered Date(s) Administered   DTaP 05/29/2001, 08/07/2001, 10/03/2001, 10/02/2002, 04/09/2005   H1N1 05/30/2008, 06/28/2008   HIB (PRP-OMP) 05/29/2001, 08/07/2001, 10/03/2001, 03/30/2002   HPV Quadrivalent 02/08/2014, 10/23/2014   Hepatitis A, Ped/Adol-2 Dose 02/08/2014, 10/23/2014   Hepatitis B May [birth year], 2001, 04/28/2001, 03/30/2002   IPV 05/29/2001, 08/07/2001, 10/03/2001, 04/09/2005   Influenza Nasal 05/30/2008, 05/16/2009, 03/20/2010, 05/20/2011, 04/17/2012   Influenza Whole 06/01/2006, 05/17/2007   Influenza, Seasonal, Injecte, Preservative Fre 04/28/2023   Influenza,Quad,Nasal, Live 05/07/2013   Influenza,inj,Quad PF,6+  Mos 05/19/2015, 04/30/2016, 06/01/2017, 04/14/2018, 03/23/2019, 04/23/2020, 03/19/2021, 06/04/2022   Influenza,inj,quad, With Preservative 06/20/2014   Influenza-Unspecified 05/19/2015, 04/30/2016, 06/01/2017, 04/14/2018, 03/23/2019, 04/23/2020, 03/19/2021   MMR  03/30/2002, 04/09/2005   Meningococcal B, OMV 03/23/2019, 04/27/2019   Meningococcal Conjugate 05/07/2013, 04/14/2018   Pneumococcal Conjugate-13 05/29/2001, 08/07/2001, 10/03/2001, 03/30/2002   Tdap 12/26/2012, 10/01/2019   Varicella 06/28/2002, 04/09/2005    Health Maintenance  Topic Date Due   COVID-19 Vaccine (1 - 2024-25 season) Never done   DTaP/Tdap/Td (8 - Td or Tdap) 09/30/2029   Pneumococcal Vaccine 58-54 Years old  Completed   INFLUENZA VACCINE  Completed   HPV VACCINES  Completed   Hepatitis C Screening  Completed   HIV Screening  Completed    Discussed health benefits of physical activity, and encouraged him to engage in regular exercise appropriate for his age and condition.  Encounter for general adult medical examination with abnormal findings Assessment & Plan: Physical exam as documented Discussed heart-healthy diet  Encouraged to Exercise: If you are able: 30 -60 minutes a day ,4 days a week, or 150 minutes a week. The longer the better. Combine stretch, strength, and aerobic activities Encourage to eat whole Food, Plant Predominant Nutrition is highly recommended: Eat Plenty of vegetables, Mushrooms, fruits, Legumes, Whole Grains, Nuts, seeds in lieu of processed meats, processed snacks/pastries red meat, poultry, eggs.  Will f/u in 1 year for CPE      IFG (impaired fasting glucose) -     Hemoglobin A1c  Vitamin D  deficiency -     VITAMIN D  25 Hydroxy (Vit-D Deficiency, Fractures)  TSH (thyroid-stimulating hormone deficiency) -     TSH + free T4  Other hyperlipidemia -     Lipid panel -     CMP14+EGFR -     CBC with Differential/Platelet  Note: This chart has been completed using Engineer, Civil (consulting) software, and while attempts have been made to ensure accuracy, certain words and phrases may not be transcribed as intended.    Return in about 6 months (around 02/22/2024).     Jamerson Vonbargen, FNP

## 2023-08-27 ENCOUNTER — Encounter: Payer: Self-pay | Admitting: Internal Medicine

## 2023-08-30 DIAGNOSIS — R7301 Impaired fasting glucose: Secondary | ICD-10-CM | POA: Diagnosis not present

## 2023-08-30 DIAGNOSIS — E559 Vitamin D deficiency, unspecified: Secondary | ICD-10-CM | POA: Diagnosis not present

## 2023-08-30 DIAGNOSIS — E038 Other specified hypothyroidism: Secondary | ICD-10-CM | POA: Diagnosis not present

## 2023-08-30 DIAGNOSIS — E7849 Other hyperlipidemia: Secondary | ICD-10-CM | POA: Diagnosis not present

## 2023-08-31 LAB — LIPID PANEL
Chol/HDL Ratio: 2.2 {ratio} (ref 0.0–5.0)
Cholesterol, Total: 155 mg/dL (ref 100–199)
HDL: 69 mg/dL (ref >39)
LDL Chol Calc (NIH): 65 mg/dL (ref 0–99)
Triglycerides: 120 mg/dL (ref 0–149)
VLDL Cholesterol Cal: 21 mg/dL (ref 5–40)

## 2023-08-31 LAB — CMP14+EGFR
ALT: 11 [IU]/L (ref 0–44)
AST: 16 [IU]/L (ref 0–40)
Albumin: 4.9 g/dL (ref 4.3–5.2)
Alkaline Phosphatase: 61 [IU]/L (ref 44–121)
BUN/Creatinine Ratio: 11 (ref 9–20)
BUN: 10 mg/dL (ref 6–20)
Bilirubin Total: 1.3 mg/dL — ABNORMAL HIGH (ref 0.0–1.2)
CO2: 27 mmol/L (ref 20–29)
Calcium: 9.7 mg/dL (ref 8.7–10.2)
Chloride: 101 mmol/L (ref 96–106)
Creatinine, Ser: 0.94 mg/dL (ref 0.76–1.27)
Globulin, Total: 2.3 g/dL (ref 1.5–4.5)
Glucose: 85 mg/dL (ref 70–99)
Potassium: 4 mmol/L (ref 3.5–5.2)
Sodium: 141 mmol/L (ref 134–144)
Total Protein: 7.2 g/dL (ref 6.0–8.5)
eGFR: 118 mL/min/{1.73_m2} (ref >59)

## 2023-08-31 LAB — CBC WITH DIFFERENTIAL/PLATELET
Basophils Absolute: 0 10*3/uL (ref 0.0–0.2)
Basos: 1 %
EOS (ABSOLUTE): 0.1 10*3/uL (ref 0.0–0.4)
Eos: 2 %
Hematocrit: 46.7 % (ref 37.5–51.0)
Hemoglobin: 14.5 g/dL (ref 13.0–17.7)
Immature Grans (Abs): 0 10*3/uL (ref 0.0–0.1)
Immature Granulocytes: 0 %
Lymphocytes Absolute: 1.2 10*3/uL (ref 0.7–3.1)
Lymphs: 21 %
MCH: 26.2 pg — ABNORMAL LOW (ref 26.6–33.0)
MCHC: 31 g/dL — ABNORMAL LOW (ref 31.5–35.7)
MCV: 84 fL (ref 79–97)
Monocytes Absolute: 0.3 10*3/uL (ref 0.1–0.9)
Monocytes: 6 %
Neutrophils Absolute: 3.7 10*3/uL (ref 1.4–7.0)
Neutrophils: 70 %
Platelets: 344 10*3/uL (ref 150–450)
RBC: 5.54 x10E6/uL (ref 4.14–5.80)
RDW: 13 % (ref 11.6–15.4)
WBC: 5.4 10*3/uL (ref 3.4–10.8)

## 2023-08-31 LAB — VITAMIN D 25 HYDROXY (VIT D DEFICIENCY, FRACTURES): Vit D, 25-Hydroxy: 32.2 ng/mL (ref 30.0–100.0)

## 2023-08-31 LAB — TSH+FREE T4
Free T4: 1.12 ng/dL (ref 0.82–1.77)
TSH: 1.7 u[IU]/mL (ref 0.450–4.500)

## 2023-08-31 LAB — HEMOGLOBIN A1C
Est. average glucose Bld gHb Est-mCnc: 105 mg/dL
Hgb A1c MFr Bld: 5.3 % (ref 4.8–5.6)

## 2023-09-04 ENCOUNTER — Encounter: Payer: Self-pay | Admitting: Family Medicine

## 2023-09-27 ENCOUNTER — Ambulatory Visit (HOSPITAL_COMMUNITY): Payer: BC Managed Care – PPO | Admitting: Clinical

## 2023-10-03 ENCOUNTER — Encounter (HOSPITAL_COMMUNITY): Payer: Self-pay

## 2023-10-03 ENCOUNTER — Ambulatory Visit (HOSPITAL_COMMUNITY): Payer: BC Managed Care – PPO | Admitting: Clinical

## 2023-11-12 NOTE — Progress Notes (Unsigned)
 Virtual Visit via Video Note  I connected with Joshua Gilmore on 11/16/23 at  1:20 PM EDT by a video enabled telemedicine application and verified that I am speaking with the correct person using two identifiers.  Location: Patient: home Provider: home office Persons participated in the visit- patient, provider    I discussed the limitations of evaluation and management by telemedicine and the availability of in person appointments. The patient expressed understanding and agreed to proceed.    I discussed the assessment and treatment plan with the patient. The patient was provided an opportunity to ask questions and all were answered. The patient agreed with the plan and demonstrated an understanding of the instructions.   The patient was advised to call back or seek an in-person evaluation if the symptoms worsen or if the condition fails to improve as anticipated.    Todd Fossa, MD    [facility] MD/PA/NP OP Progress Note  11/16/2023 1:45 PM Joshua Gilmore  MRN:  [id number]  Chief Complaint:  Chief Complaint  Patient presents with   Follow-up   HPI:  This is a follow-up appointment for adjustment disorder with depressed mood and ADHD.  He states that he is concerned about his cat.  He was sick with vomiting at 1 time.  Although it is better, he wants to be closer to [place], which is unusual.  He states that the work is not going great.  He has new supervisor, who tends to mislead.  Other people has left.  Although he wonders if he can be transferred back to his old job, he does not think they need new staff.  There has been Marketing executive.  He is doing washing dishes.  He plays a game on days off.  He feels lonely.  Although there was one friend, this friend seems to be wanting to be with others.  Although he feels hurt by this, he is just waiting to see if he is interested again.  He believes that one co-worker in higher management has crash on him. They rarely see each other. He  tries to keep in mind his mother was advised to not to be friends/have romantic relationship with people at work.  Although he occasionally feels that she is limiting his life, he knows that she is supportive, and is trying to help him.  He reports slight worsening in self-criticism.  He denies change in appetite.  He sleeps several hours.  He denies SI, HI.  He denies hallucinations or paranoia.  He thinks his focus has been good.  He agrees with the plan as outlined below.    Wt Readings from Last 3 Encounters:  08/25/23 141 lb (64 kg)  04/28/23 141 lb (64 kg)  03/31/23 140 lb 3.2 oz (63.6 kg)     Substance use   Tobacco Alcohol Other substances/  Current   denies denies  Past   Only once in 2023- did not like the taste denies  Past Treatment           Visit Diagnosis:    ICD-10-CM   1. Adjustment disorder with mixed anxiety and depressed mood  F43.23     2. Attention deficit hyperactivity disorder (ADHD), combined type  F90.2       Past Psychiatric History: Please see initial evaluation for full details. I have reviewed the history. No updates at this time.     Past Medical History:  Past Medical History:  Diagnosis Date   ADHD (attention deficit hyperactivity  disorder)    Allergic rhinitis    Autism spectrum disorder    Generalized anxiety disorder 05/07/2013   No past surgical history on file.  Family Psychiatric History: Please see initial evaluation for full details. I have reviewed the history. No updates at this time.     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  Tobacco Use   Smoking status: Never   Smokeless tobacco: Never  Vaping Use   Vaping status: Never Used  Substance and Sexual Activity   Alcohol use: Never   Drug use: Never   Sexual activity: Not Currently  Other Topics Concern   Not on file  Social History Narrative   Lives with mom,sister away at college   Social Drivers of Health   Financial Resource Strain: Low Risk  (08/25/2023)   Overall Financial Resource Strain (CARDIA)    Difficulty of Paying Living Expenses: Not hard at all  Food Insecurity: No Food Insecurity (08/25/2023)   Hunger Vital Sign    Worried About Running Out of Food in the Last Year: Never true    Ran Out of Food in the Last Year: Never true  Transportation Needs: No Transportation Needs (08/25/2023)   PRAPARE - Administrator, Civil Service (Medical): No    Lack of Transportation (Non-Medical): No  Physical Activity: Unknown (08/25/2023)   Exercise Vital Sign    Days of Exercise per Week: 0 days    Minutes of Exercise per Session: Not on file  Stress: No Stress Concern Present (08/25/2023)   [employer] of Occupational Health - Occupational Stress Questionnaire    Feeling of Stress : Only a little  Social Connections: Moderately Isolated (08/25/2023)   Social Connection and Isolation Panel [NHANES]    Frequency of Communication with Friends and Family: Once a week    Frequency of Social Gatherings with Friends and Family: Once a week    Attends Religious Services: 1 to 4 times per year    Active Member of Clubs or Organizations: Yes    Attends Banker Meetings: 1 to 4 times per year    Marital Status: Never married    Allergies: No Known Allergies  Metabolic Disorder Labs: Lab Results  Component Value Date   HGBA1C 5.3 08/30/2023   No results found for: "PROLACTIN" Lab Results  Component Value Date   CHOL 155 08/30/2023   TRIG 120 08/30/2023    HDL 69 08/30/2023   CHOLHDL 2.2 08/30/2023   LDLCALC 65 08/30/2023   LDLCALC 87 05/02/2020   Lab Results  Component Value Date   TSH 1.700 08/30/2023   TSH 0.865 07/29/2021    Therapeutic Level Labs: No results found for: "LITHIUM" No results found for: "VALPROATE" No results found for: "CBMZ"  Current Medications: Current Outpatient Medications  Medication Sig Dispense Refill   buPROPion  (WELLBUTRIN  XL) 300 MG 24 hr tablet Take 1 tablet (300 mg total) by mouth daily. 30  tablet 2   cyclobenzaprine  (FLEXERIL ) 5 MG tablet Take 1 tablet (5 mg total) by mouth 2 (two) times daily as needed for muscle spasms. 30 tablet 0   fluticasone  (FLONASE ) 50 MCG/ACT nasal spray Place 1 spray into both nostrils 2 (two) times daily. 16 g 2   ibuprofen  (ADVIL ) 800 MG tablet Take 1 tablet (800 mg total) by mouth every 8 (eight) hours as needed. 30 tablet 0   No current facility-administered medications for this visit.     Musculoskeletal: Strength & Muscle Tone:  N/A Gait & Station:  N/A Patient leans: N/A  Psychiatric Specialty Exam: Review of Systems  Psychiatric/Behavioral:  Positive for dysphoric mood. Negative for agitation, behavioral problems, confusion, decreased concentration, hallucinations, self-injury, sleep disturbance and suicidal ideas. The patient is not nervous/anxious and is not hyperactive.   All other systems reviewed and are negative.   There were no vitals taken for this visit.There is no height or weight on file to calculate BMI.  General Appearance: Well Groomed  Eye Contact:  Good  Speech:  Clear and Coherent  Volume:  Normal  Mood:   not great  Affect:  Appropriate, Congruent, and calm  Thought Process:  Coherent  Orientation:  Full (Time, Place, and Person)  Thought Content: Logical   Suicidal Thoughts:  No  Homicidal Thoughts:  No  Memory:  Immediate;   Good  Judgement:  Good  Insight:  Good  Psychomotor Activity:  Normal  Concentration:  Concentration:  Good and Attention Span: Good  Recall:  Good  Fund of Knowledge: Good  Language: Good  Akathisia:  No  Handed:  Right  AIMS (if indicated): not done  Assets:  Communication Skills Desire for Improvement  ADL's:  Intact  Cognition: WNL  Sleep:  Fair   Screenings: GAD-7    Flowsheet Row Office Visit from 02/14/2023 in [facility] Office Visit from 12/29/2022 in [facility] from 11/29/2022 in [facility] [place] Office Visit from 05/13/2022 in [facility] Office Visit from 02/15/2022 in [facility]  Total GAD-7 Score 3 1 8 2  0      PHQ2-9    Flowsheet Row Office Visit from 08/25/2023 in [facility] Office Visit from 04/28/2023 in [facility] Office Visit from 02/14/2023 in [facility] Office Visit from 01/24/2023 in [facility] Office Visit from 12/29/2022 in [facility]  PHQ-2 Total Score 0 0 0 0 0  PHQ-9 Total Score -- -- -- -- 2      Flowsheet Row ED from 06/08/2023 in [facility] from 11/29/2022 in [facility] [place] ED from 09/11/2022 in [facility]  C-SSRS RISK CATEGORY No Risk No Risk No Risk        Assessment and Plan:  Joshua Gilmore is a 23 y.o. year old male with a history of autism, ADHD, mild intellectual developmental disorder, who presents for follow up appointment for below.   1. Adjustment disorder with mixed anxiety and depressed mood Acute stressors include: work related stress Other stressors include: loneliness   History: non adherent to medication,    He reports slight worsening in down mood and self-criticism in the context of work-related  stress.  We uptitrate bupropion  to optimize treatment for depressive symptoms.  2. Attention deficit hyperactivity disorder (ADHD), combined type - received neuropsych testing at [facility] 10/2019    Overall stable.  Will continue bupropion  to target ADHD.   3. Autism spectrum disorder 3. Intellectual developmental disorder, mild - received neuropsych testing at [facility] During visits since initial evaluation, he has not displayed symptoms to be concerned of psychotic disorder except that he has paranoia/anxiety and some tangential thought process on initial visit, which be more attributable to anxiety. No known history of schizophrenia, and no safety concern of aggression reported by his caregiver.      Plan Increase bupropion  300 mg daily  Next appointment: 7/2 at 1 20, video   The patient demonstrates the following risk factors for suicide: Chronic risk factors for suicide include: psychiatric disorder of autism, ADHD . Acute risk factors for suicide include: social withdrawal/isolation. Protective factors for this patient include: positive social support. Considering these factors, the overall suicide risk at this point appears to be low. Patient is appropriate for outpatient follow up.     Collaboration of Care: Collaboration of Care: Other reviewed notes in Epic  Patient/Guardian was advised Release of Information must be obtained prior to any record release in order to collaborate their care with an outside provider. Patient/Guardian was advised if they have not already done so to contact the registration department to sign all necessary forms in order for us  to release information regarding their care.   Consent: Patient/Guardian gives verbal consent for treatment and assignment of benefits for services provided during this visit. Patient/Guardian expressed understanding and agreed to proceed.    Todd Fossa, MD 11/16/2023, 1:45 PM

## 2023-11-16 ENCOUNTER — Telehealth: Payer: Self-pay | Admitting: Psychiatry

## 2023-11-16 ENCOUNTER — Encounter: Payer: Self-pay | Admitting: Psychiatry

## 2023-11-16 DIAGNOSIS — F4323 Adjustment disorder with mixed anxiety and depressed mood: Secondary | ICD-10-CM | POA: Diagnosis not present

## 2023-11-16 DIAGNOSIS — F902 Attention-deficit hyperactivity disorder, combined type: Secondary | ICD-10-CM

## 2023-11-16 MED ORDER — BUPROPION HCL ER (XL) 300 MG PO TB24: 300.0000 mg | ORAL_TABLET | Freq: Every day | ORAL | 2 refills | Status: DC

## 2023-11-16 NOTE — Patient Instructions (Signed)
 Increase bupropion  300 mg daily  Next appointment: 7/2 at 1 20,

## 2023-11-21 ENCOUNTER — Encounter: Payer: Self-pay | Admitting: Internal Medicine

## 2023-11-21 ENCOUNTER — Ambulatory Visit (INDEPENDENT_AMBULATORY_CARE_PROVIDER_SITE_OTHER): Admitting: Internal Medicine

## 2023-11-21 VITALS — BP 123/73 | HR 70 | Ht 72.0 in | Wt 143.8 lb

## 2023-11-21 DIAGNOSIS — B354 Tinea corporis: Secondary | ICD-10-CM | POA: Diagnosis not present

## 2023-11-21 MED ORDER — CLOTRIMAZOLE-BETAMETHASONE 1-0.05 % EX CREA: 1.0000 | TOPICAL_CREAM | Freq: Two times a day (BID) | CUTANEOUS | 1 refills | Status: DC

## 2023-11-21 NOTE — Progress Notes (Signed)
 Acute Office Visit  Subjective:    Patient ID: Joshua Gilmore, male    DOB: 16-Dec-2000, 23 y.o.   MRN: [id number]  Chief Complaint  Patient presents with   Rash    Pt reports rash under his arm, reports it seems better but area is still burning,     HPI Patient is in today for complaint of rash underneath bilateral axilla for the last 2 weeks.  He has itching and burning sensation in the area as well.  Denies any recent insect bite.  Denies any rash in the groin area.  Past Medical History:  Diagnosis Date   ADHD (attention deficit hyperactivity disorder)    Allergic rhinitis    Autism spectrum disorder    Generalized anxiety disorder 05/07/2013    History reviewed. No pertinent surgical history.  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  Tobacco Use   Smoking status: Never   Smokeless tobacco: Never  Vaping Use   Vaping status: Never Used  Substance and Sexual Activity   Alcohol use: Never   Drug use: Never   Sexual activity: Not Currently  Other Topics Concern   Not on file  Social History Narrative   Lives with mom,sister away at college   Social Drivers of Health   Financial Resource Strain: Low Risk  (08/25/2023)   Overall Financial Resource Strain (CARDIA)    Difficulty of Paying Living Expenses: Not hard at all  Food Insecurity: No Food Insecurity (08/25/2023)   Hunger Vital Sign    Worried About Running Out of Food in the Last Year: Never true    Ran Out of Food in the Last Year: Never true  Transportation Needs: No  Transportation Needs (08/25/2023)   PRAPARE - Administrator, Civil Service (Medical): No    Lack of Transportation (Non-Medical): No  Physical Activity: Unknown (08/25/2023)   Exercise Vital Sign    Days of Exercise per Week: 0 days    Minutes of Exercise per Session: Not on file  Stress: No Stress Concern Present (08/25/2023)   [employer] of Occupational Health - Occupational Stress Questionnaire    Feeling of Stress : Only a little  Social Connections: Moderately Isolated (08/25/2023)   Social Connection and Isolation Panel [NHANES]    Frequency of Communication with Friends and Family: Once a week    Frequency of Social Gatherings with Friends and Family: Once a week    Attends Religious Services: 1 to 4 times per year    Active Member of [employer] or Organizations: Yes    Attends Banker Meetings: 1 to 4 times per year    Marital Status: Never married  Intimate Partner Violence: Not At Risk (04/23/2020)   Humiliation, Afraid, Rape, and Kick questionnaire    Fear of Current or Ex-Partner: No    Emotionally Abused: No    Physically Abused: No    Sexually Abused: No    Outpatient Medications Prior to Visit  Medication Sig Dispense Refill   buPROPion  (WELLBUTRIN  XL) 300 MG 24 hr tablet Take 1 tablet (300 mg total) by mouth daily. 30 tablet 2   cyclobenzaprine  (FLEXERIL ) 5 MG tablet Take 1 tablet (5 mg total) by mouth 2 (two) times daily as needed for muscle spasms. 30 tablet 0   fluticasone  (FLONASE ) 50 MCG/ACT nasal spray Place 1 spray into both nostrils 2 (two) times daily. 16 g 2   ibuprofen  (ADVIL ) 800 MG tablet Take 1 tablet (800 mg total) by mouth every 8 (eight) hours as needed. 30 tablet 0   No facility-administered medications prior to visit.    No Known Allergies  Review of Systems  Constitutional:  Negative for chills and fever.  HENT:  Negative for congestion and sinus pressure.   Respiratory:  Negative for cough and shortness of breath.    Cardiovascular:  Negative for chest pain and palpitations.  Musculoskeletal:  Negative for neck pain and neck stiffness.  Skin:  Positive for rash.  Neurological:  Negative for dizziness and weakness.       Objective:    Physical Exam Vitals reviewed.  Constitutional:      General: He is not in acute distress.    Appearance: He is not diaphoretic.  HENT:     Head: Normocephalic and atraumatic.  Eyes:     General: No scleral icterus.    Extraocular Movements: Extraocular movements intact.  Cardiovascular:     Rate and Rhythm: Normal rate and regular rhythm.     Heart sounds: Normal heart sounds. No murmur heard. Pulmonary:     Breath sounds: Normal breath sounds. No wheezing or rales.  Musculoskeletal:     Lumbar back: Tenderness (Paraspinal tenderness in upper lumbar area) present. Negative right straight leg raise test and negative left straight leg raise test.     Right knee: No swelling. Normal range of motion. No tenderness.  Skin:    General: Skin is warm.     Findings: Rash (Erythematous patches in bilateral axilla) present.  Neurological:     General: No focal deficit present.     Mental Status: He is alert and oriented to person, place, and time.  Psychiatric:        Mood and Affect: Mood normal.        Speech: Speech is delayed.        Behavior: Behavior is slowed.     BP 123/73   Pulse 70   Ht 6' (1.829 m)   Wt 143 lb 12.8 oz (65.2 kg)   SpO2 98%   BMI 19.50 kg/m  Wt Readings from Last 3 Encounters:  11/21/23 143 lb 12.8 oz (65.2 kg)  08/25/23 141 lb (64 kg)  04/28/23 141 lb (64 kg)        Assessment & Plan:   Problem List Items Addressed This Visit       Musculoskeletal and Integument   Tinea corporis - Primary   Rash likely tinea corporis Started Lotrisone cream Advised to keep area clean and dry       Relevant Medications   clotrimazole-betamethasone (LOTRISONE) cream     Meds ordered this encounter  Medications    clotrimazole-betamethasone (LOTRISONE) cream    Sig: Apply 1 Application topically 2 (two) times daily.    Dispense:  60 g    Refill:  1     Xochitl Egle Alyssa Backbone, MD

## 2023-11-21 NOTE — Patient Instructions (Signed)
 Please apply Lotrisone cream as prescribed.  Please follow regular hygiene practices and keep area clean and dry.

## 2023-11-21 NOTE — Assessment & Plan Note (Signed)
 Rash likely tinea corporis Started Lotrisone cream Advised to keep area clean and dry

## 2024-01-14 NOTE — Progress Notes (Unsigned)
 Virtual Visit via Video Note  I connected with Joshua Gilmore on 01/18/24 at  1:20 PM EDT by a video enabled telemedicine application and verified that I am speaking with the correct person using two identifiers.  Location: Patient: hotel Provider: home office Persons participated in the visit- patient, provider    I discussed the limitations of evaluation and management by telemedicine and the availability of in person appointments. The patient expressed understanding and agreed to proceed.     I discussed the assessment and treatment plan with the patient. The patient was provided an opportunity to ask questions and all were answered. The patient agreed with the plan and demonstrated an understanding of the instructions.   The patient was advised to call back or seek an in-person evaluation if the symptoms worsen or if the condition fails to improve as anticipated.   Katheren Sleet, MD   [facility] MD/PA/NP OP Progress Note  01/18/2024 2:02 PM Joshua Gilmore  MRN:  [id number]  Chief Complaint:  Chief Complaint  Patient presents with   Follow-up   HPI:  This is a follow-up appointment for adjustment disorder and ADHD.  He states that he is currently at the hotel due to issues with air-conditioner.  He has met his father, who he has not seen for 20 years.  His mother reach out to him, and he visited their house.  Although he did not like that his father brought him for a steak instead of hamburger, he liked the encounter.  They will be meeting again.  Although he denies any difference since uptitration of bupropion , he denies much stress at work.  He worked on the day of for extra money.  He reports good relationship with his supervisor, and other coworkers.  His anxiety has been reduced to 3.*He has fair sleep except that he needs to sleep on the floor at the hotel.  He denies feeling depressed.  He has good appetite.  He denies SI, HI, hallucinations.  He believes his focus has been  good.  He agrees with the plans as outlined below.   Wt Readings from Last 3 Encounters:  11/21/23 143 lb 12.8 oz (65.2 kg)  08/25/23 141 lb (64 kg)  04/28/23 141 lb (64 kg)     Substance use   Tobacco Alcohol Other substances/  Current   denies denies  Past   Only once in 2023- did not like the taste denies  Past Treatment              Visit Diagnosis:    ICD-10-CM   1. Adjustment disorder with mixed anxiety and depressed mood  F43.23     2. Attention deficit hyperactivity disorder (ADHD), combined type  F90.2       Past Psychiatric History: Please see initial evaluation for full details. I have reviewed the history. No updates at this time.     Past Medical History:  Past Medical History:  Diagnosis Date   ADHD (attention deficit hyperactivity disorder)    Allergic rhinitis    Autism spectrum disorder    Generalized anxiety disorder 05/07/2013   No past surgical history on file.  Family Psychiatric History: Please see initial evaluation for full details. I have reviewed the history. No updates at this time.     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  Tobacco Use   Smoking status: Never   Smokeless tobacco: Never  Vaping Use   Vaping status: Never Used  Substance and Sexual Activity   Alcohol use: Never   Drug use: Never   Sexual activity: Not Currently  Other Topics Concern   Not on file  Social History Narrative   Lives with mom,sister away at college   Social Drivers of Health    Financial Resource Strain: Low Risk  (08/25/2023)   Overall Financial Resource Strain (CARDIA)    Difficulty of Paying Living Expenses: Not hard at all  Food Insecurity: No Food Insecurity (08/25/2023)   Hunger Vital Sign    Worried About Running Out of Food in the Last Year: Never true    Ran Out of Food in the Last Year: Never true  Transportation Needs: No Transportation Needs (08/25/2023)   PRAPARE - Administrator, Civil Service (Medical): No    Lack of Transportation (Non-Medical): No  Physical Activity: Unknown (08/25/2023)   Exercise Vital Sign    Days of Exercise per Week: 0 days    Minutes of Exercise per Session: Not on file  Stress: No Stress Concern Present (08/25/2023)   [employer] of Occupational Health - Occupational Stress Questionnaire    Feeling of Stress : Only a little  Social Connections: Moderately Isolated (08/25/2023)   Social Connection and Isolation Panel    Frequency of Communication with Friends and Family: Once a week    Frequency of Social Gatherings with Friends and Family: Once a week    Attends Religious Services: 1 to 4 times per year    Active Member of Clubs or Organizations: Yes    Attends Banker Meetings: 1 to 4 times per year    Marital Status: Never married    Allergies: No Known Allergies  Metabolic Disorder Labs: Lab Results  Component Value Date   HGBA1C 5.3 08/30/2023   No results found for: PROLACTIN Lab Results  Component Value Date   CHOL 155 08/30/2023   TRIG 120 08/30/2023   HDL 69 08/30/2023   CHOLHDL 2.2 08/30/2023   LDLCALC 65 08/30/2023   LDLCALC 87 05/02/2020   Lab Results  Component Value Date   TSH 1.700 08/30/2023   TSH 0.865 07/29/2021    Therapeutic Level Labs: No results found for: LITHIUM No results found for: VALPROATE No results found for: CBMZ  Current Medications: Current Outpatient Medications  Medication Sig Dispense Refill   buPROPion  (WELLBUTRIN  XL) 300 MG  24 hr tablet Take 1 tablet (300 mg total) by mouth daily. 30 tablet 2   clotrimazole -betamethasone  (LOTRISONE ) cream Apply 1 Application topically 2 (two) times daily. 60 g 1   cyclobenzaprine  (FLEXERIL ) 5 MG tablet Take 1 tablet (5 mg total) by mouth 2 (two) times daily as needed for muscle spasms. 30 tablet 0   fluticasone  (FLONASE ) 50 MCG/ACT nasal spray Place 1 spray into both nostrils 2 (two) times daily. 16 g 2   ibuprofen  (ADVIL ) 800 MG tablet Take 1 tablet (800 mg total) by mouth every 8 (eight) hours as needed. 30 tablet 0  No current facility-administered medications for this visit.     Musculoskeletal: Strength & Muscle Tone: N/A Gait & Station: N/A Patient leans: N/A  Psychiatric Specialty Exam: Review of Systems  Psychiatric/Behavioral:  Negative for agitation, behavioral problems, confusion, decreased concentration, dysphoric mood, hallucinations, self-injury, sleep disturbance and suicidal ideas. The patient is not nervous/anxious and is not hyperactive.   All other systems reviewed and are negative.   There were no vitals taken for this visit.There is no height or weight on file to calculate BMI.  General Appearance: Well Groomed  Eye Contact:  Good  Speech:  Clear and Coherent  Volume:  Normal  Mood:  fine  Affect:  Appropriate, Congruent, and calm  Thought Process:  Coherent  Orientation:  Full (Time, Place, and Person)  Thought Content: Logical   Suicidal Thoughts:  No  Homicidal Thoughts:  No  Memory:  Immediate;   Good  Judgement:  Good  Insight:  Good  Psychomotor Activity:  Normal  Concentration:  Concentration: Good and Attention Span: Good  Recall:  Good  Fund of Knowledge: Good  Language: Good  Akathisia:  No  Handed:  Right  AIMS (if indicated): not done  Assets:  Communication Skills Desire for Improvement  ADL's:  Intact  Cognition: WNL  Sleep:  Good   Screenings: GAD-7    Flowsheet Row Office Visit from 11/21/2023 in [facility] Office Visit from 02/14/2023 in [facility] Office Visit from 12/29/2022 in [facility] Counselor from 11/29/2022 in [facility] Outpatient [facility] at [place] Office Visit from 05/13/2022 in [facility]  Total GAD-7 Score 0 3 1 8 2    PHQ2-9    Flowsheet Row Office Visit from 11/21/2023 in [facility] Office Visit from 08/25/2023 in [facility] Office Visit from 04/28/2023 in [facility] Office Visit from 02/14/2023 in [facility] Office Visit from 01/24/2023 in [facility]  PHQ-2 Total Score 0 0 0 0 0  PHQ-9 Total Score 0 -- -- -- --   Flowsheet Row UC from 06/08/2023 in [facility] Urgent Care at [place] Counselor from 11/29/2022 in [facility] Outpatient [facility] Health at [place] ED from 09/11/2022 in [facility]  C-SSRS RISK CATEGORY No Risk No Risk No Risk     Assessment and Plan:  Joshua Gilmore is a 23 y.o. year old male with a history of autism, ADHD, mild intellectual developmental disorder, who presents for follow up appointment for below.    1. Adjustment disorder with mixed anxiety and depressed mood Acute stressors include: work related stress Other stressors include: loneliness   History: non adherent to medication,   There has been improvement in depressed mood and anxiety/self-criticism, which coincided with him saying his father after 20 years of absence, and uptitration of bupropion .  Will continue the current dose to target his mood symptoms.   2. Attention deficit hyperactivity disorder (ADHD), combined type - received neuropsych testing at [facility] 10/2019     Overall stable.  Will continue current dose of bupropion  to target ADHD.    3. Autism spectrum disorder 3.  Intellectual developmental disorder, mild - received neuropsych testing at [facility] During visits since initial evaluation, he has not displayed symptoms to be concerned of psychotic disorder except that he had paranoia/anxiety and some tangential thought process on initial visit,  which be more attributable to anxiety. No known history of schizophrenia, and no safety concern of aggression reported by his caregiver.      Plan Continue bupropion  300 mg daily  Next appointment: 9/10 at 1 pm, video   The patient demonstrates the following risk factors for suicide: Chronic risk factors for suicide include: psychiatric disorder of autism, ADHD . Acute risk factors for suicide include: social withdrawal/isolation. Protective factors for this patient include: positive social support. Considering these factors, the overall suicide risk at this point appears to be low. Patient is appropriate for outpatient follow up.     Collaboration of Care: Collaboration of Care: Other reviewed notes in Epic  Patient/Guardian was advised Release of Information must be obtained prior to any record release in order to collaborate their care with an outside provider. Patient/Guardian was advised if they have not already done so to contact the registration department to sign all necessary forms in order for us  to release information regarding their care.   Consent: Patient/Guardian gives verbal consent for treatment and assignment of benefits for services provided during this visit. Patient/Guardian expressed understanding and agreed to proceed.    Katheren Sleet, MD 01/18/2024, 2:02 PM

## 2024-01-18 ENCOUNTER — Encounter: Payer: Self-pay | Admitting: Psychiatry

## 2024-01-18 ENCOUNTER — Telehealth (INDEPENDENT_AMBULATORY_CARE_PROVIDER_SITE_OTHER): Admitting: Psychiatry

## 2024-01-18 DIAGNOSIS — F4323 Adjustment disorder with mixed anxiety and depressed mood: Secondary | ICD-10-CM

## 2024-01-18 DIAGNOSIS — F902 Attention-deficit hyperactivity disorder, combined type: Secondary | ICD-10-CM

## 2024-01-18 NOTE — Patient Instructions (Signed)
 Continue bupropion  300 mg daily  Next appointment: 9/10 at 1 pm

## 2024-02-22 ENCOUNTER — Ambulatory Visit: Payer: BC Managed Care – PPO | Admitting: Internal Medicine

## 2024-03-23 NOTE — Progress Notes (Signed)
 Virtual Visit via Video Note  I connected with Joshua Gilmore on 03/28/24 at  1:00 PM EDT by a video enabled telemedicine application and verified that I am speaking with the correct person using two identifiers.  Location: Patient: home Provider: home office Persons participated in the visit- patient, provider    I discussed the limitations of evaluation and management by telemedicine and the availability of in person appointments. The patient expressed understanding and agreed to proceed.      I discussed the assessment and treatment plan with the patient. The patient was provided an opportunity to ask questions and all were answered. The patient agreed with the plan and demonstrated an understanding of the instructions.   The patient was advised to call back or seek an in-person evaluation if the symptoms worsen or if the condition fails to improve as anticipated.   Katheren Sleet, MD    [facility] MD/PA/NP OP Progress Note  03/28/2024 5:20 PM Joshua Gilmore  MRN:  [id number]  Chief Complaint:  Chief Complaint  Patient presents with   Follow-up   HPI:  This is a follow-up appointment for adjustment disorder with depressed mood and ADHD.  He was not available when this Clinical research associate called.  His mother was contacted, and she was on the way back home.  He signed in to the visit later.  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  The work is going good.  However, it is short staffed.  He reports frustration that some people are lazy.  Some people want to leave the work, and he was to do the same.  When he is asked about the positive aspect of the work, he states that cleaning the machine is nice.  He was asked to put the trash.  He hates it as it is heavy.  He feels the boss is forcing this, and he will be written up otherwise.  Although he does not mind getting written up, he tries not get into the habit of being written up.  Although he does not feel like going to work, he denies  concern about his mood.  He has good appetite.  He reports insomnia (his mother states that he plays video game at night).  He enjoys doing art work instead of playing video games all the time.  He denies SI, HI, hallucinations.  He feels comfortable to stay on the current medication.   Substance use   Tobacco Alcohol Other substances/  Current   denies denies  Past   Only once in 2023- did not like the taste denies  Past Treatment            Visit Diagnosis:    ICD-10-CM   1. Adjustment disorder with mixed anxiety and depressed mood  F43.23     2. Attention deficit hyperactivity disorder (ADHD), combined type  F90.2     3. Autism spectrum disorder  F84.0     4. Intellectual developmental disorder, mild  F70       Past Psychiatric History: Please see initial evaluation for full details. I have reviewed the history. No updates at this time.     Past Medical History:  Past Medical History:  Diagnosis Date   ADHD (attention deficit hyperactivity disorder)    Allergic rhinitis    Autism spectrum disorder    Generalized anxiety disorder 05/07/2013   History reviewed. No pertinent surgical history.  Family Psychiatric History: Please see initial evaluation for full details. I have reviewed the  history. No updates at this time.    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  Tobacco Use   Smoking status: Never   Smokeless tobacco: Never  Vaping Use   Vaping status: Never Used  Substance and Sexual Activity    Alcohol use: Never   Drug use: Never   Sexual activity: Not Currently  Other Topics Concern   Not on file  Social History Narrative   Lives with mom,sister away at college   Social Drivers of Health   Financial Resource Strain: Low Risk  (03/26/2024)   Overall Financial Resource Strain (CARDIA)    Difficulty of Paying Living Expenses: Not hard at all  Food Insecurity: No Food Insecurity (03/26/2024)   Hunger Vital Sign    Worried About Running Out of Food in the Last Year: Never true    Ran Out of Food in the Last Year: Never true  Transportation Needs: No Transportation Needs (03/26/2024)   PRAPARE - Administrator, Civil Service (Medical): No    Lack of Transportation (Non-Medical): No  Physical Activity: Inactive (03/26/2024)   Exercise Vital Sign    Days of Exercise per Week: 0 days    Minutes of Exercise per Session: Not on file  Stress: No Stress Concern Present (03/26/2024)   [employer] of Occupational Health - Occupational Stress Questionnaire    Feeling of Stress: Only a little  Social Connections: Moderately Isolated (03/26/2024)   Social Connection and Isolation Panel    Frequency of Communication with Friends and Family: Three times a week    Frequency of Social Gatherings with Friends and Family: Twice a week    Attends Religious Services: 1 to 4 times per year    Active Member of Clubs or Organizations: No    Attends Engineer, structural: Not on file    Marital Status: Never married    Allergies: No Known Allergies  Metabolic Disorder Labs: Lab Results  Component Value Date   HGBA1C 5.3 08/30/2023   No results found for: PROLACTIN Lab Results  Component Value Date   CHOL 155 08/30/2023   TRIG 120 08/30/2023   HDL 69 08/30/2023   CHOLHDL 2.2 08/30/2023   LDLCALC 65 08/30/2023   LDLCALC 87 05/02/2020   Lab Results  Component Value Date   TSH 1.700 08/30/2023   TSH 0.865 07/29/2021    Therapeutic Level Labs: No results found  for: LITHIUM No results found for: VALPROATE No results found for: CBMZ  Current Medications: Current Outpatient Medications  Medication Sig Dispense Refill   buPROPion  (WELLBUTRIN  XL) 300 MG 24 hr tablet Take 1 tablet (300 mg total) by mouth daily. 30 tablet 2   mupirocin  ointment (BACTROBAN ) 2 % Apply 1 Application topically 2 (two) times daily. 22 g 0   No current facility-administered medications for this visit.     Musculoskeletal: Strength & Muscle Tone: N/A Gait & Station: N/A Patient leans: N/A  Psychiatric Specialty Exam: Review of Systems  Psychiatric/Behavioral:  Positive for sleep disturbance. Negative for agitation, behavioral problems, confusion, decreased concentration, dysphoric mood, hallucinations, self-injury and suicidal ideas. The patient is not nervous/anxious and is not hyperactive.   All other systems reviewed and are negative.   There were no vitals taken for this visit.There is no height or weight on file to calculate BMI.  General Appearance: Well Groomed  Eye Contact:  Good  Speech:  Clear and Coherent  Volume:  Normal  Mood:  fine  Affect:  Appropriate, Congruent, and Restricted  Thought Process:  Coherent  Orientation:  Full (Time, Place, and Person)  Thought Content: Logical   Suicidal Thoughts:  No  Homicidal Thoughts:  No  Memory:  Immediate;   Good  Judgement:  Good  Insight:  Good  Psychomotor Activity:  Normal  Concentration:  Concentration: Good and Attention Span: Good  Recall:  Good  Fund of Knowledge: Good  Language: Good  Akathisia:  No  Handed:  Right  AIMS (if indicated): not done  Assets:  Communication Skills Desire for Improvement  ADL's:  Intact  Cognition: WNL  Sleep:  Poor   Screenings: GAD-7    Flowsheet Row Office Visit from 03/26/2024 in [facility] Office Visit from 11/21/2023 in [facility] Office Visit from 02/14/2023 in [facility] Office Visit from 12/29/2022 in [facility] Counselor from 11/29/2022 in [facility]patient Behavioral [facility] at [place]  Total GAD-7 Score 0 0 3 1 8    PHQ2-9    Flowsheet Row Office Visit from 03/26/2024 in [facility] Office Visit from 11/21/2023 in [facility] Office Visit from 08/25/2023 in [facility] Care Office Visit from 04/28/2023 in [facility] Care Office Visit from 02/14/2023 in [facility]  PHQ-2 Total Score 0 0 0 0 0  PHQ-9 Total Score 0 0 -- -- --   Flowsheet Row UC from 06/08/2023 in [facility] Care at [place] Counselor from 11/29/2022 in [facility] Health Outpatient Behavior[facility] at [place] ED from 09/11/2022 in [facility]  C-SSRS RISK CATEGORY No Risk No Risk No Risk     Assessment and Plan:  Hawthorne Day is a 23 y.o. year old male with a history of autism, ADHD, mild intellectual developmental disorder, who presents for follow up appointment for below.   1. Adjustment disorder with mixed anxiety and depressed mood Other stressors include: loneliness   History: non adherence to medication at times   Although he reports occasional down mood and frustration related to work, those are self limited, and he denies concern otherwise.  He started to enjoy doing art crafts instead of video games. Will continue current dose of bupropion  to target depressed mood.   2. Attention deficit hyperactivity disorder (ADHD), combined type - received neuropsych testing at [facility] 10/2019     Overall stable.  Will continue current dose of bupropion  to target ADHD.    3. Autism spectrum disorder 3. Intellectual developmental disorder, mild - received neuropsych testing at [facility] During visits since initial evaluation, he has not displayed symptoms to be concerned of  psychotic disorder except that he had paranoia/anxiety and some tangential thought process on initial visit, which be more attributable to anxiety. No known history of schizophrenia, and no safety concern of aggression reported by his caregiver.  Plan Continue bupropion  300 mg daily  Next appointment: 11/5 at 1 20, video   The patient demonstrates the following risk factors for suicide: Chronic risk factors for suicide include: psychiatric disorder of autism, ADHD . Acute risk factors for suicide include: social withdrawal/isolation. Protective factors for this patient include: positive social support. Considering these factors, the overall suicide risk at this point appears to be low. Patient is appropriate for outpatient follow up.     Collaboration of Care: Collaboration of Care: Other reviewed notes in Epic  Patient/Guardian was advised Release of Information must be obtained prior to any record release in order to collaborate their care with an outside provider. Patient/Guardian was advised if they have not already done so to contact the registration department to sign all necessary forms in order for us  to release information regarding their care.   Consent: Patient/Guardian gives verbal consent for treatment and assignment of benefits for services provided during this visit. Patient/Guardian expressed understanding and agreed to proceed.    Katheren Sleet, MD 03/28/2024, 5:20 PM

## 2024-03-26 ENCOUNTER — Ambulatory Visit (INDEPENDENT_AMBULATORY_CARE_PROVIDER_SITE_OTHER): Admitting: Internal Medicine

## 2024-03-26 VITALS — Ht 72.0 in

## 2024-03-26 DIAGNOSIS — S61216A Laceration without foreign body of right little finger without damage to nail, initial encounter: Secondary | ICD-10-CM

## 2024-03-26 DIAGNOSIS — S61217A Laceration without foreign body of left little finger without damage to nail, initial encounter: Secondary | ICD-10-CM

## 2024-03-26 DIAGNOSIS — F840 Autistic disorder: Secondary | ICD-10-CM | POA: Diagnosis not present

## 2024-03-26 DIAGNOSIS — F418 Other specified anxiety disorders: Secondary | ICD-10-CM | POA: Diagnosis not present

## 2024-03-26 DIAGNOSIS — Z23 Encounter for immunization: Secondary | ICD-10-CM

## 2024-03-26 MED ORDER — MUPIROCIN 2 % EX OINT: 1.0000 | TOPICAL_OINTMENT | Freq: Two times a day (BID) | CUTANEOUS | 0 refills | Status: DC

## 2024-03-26 NOTE — Progress Notes (Unsigned)
 Established Patient Office Visit  Subjective:  Patient ID: Joshua Gilmore, male    DOB: February 06, 2001  Age: 23 y.o. MRN: [id number]  CC:  Chief Complaint  Patient presents with   Medical Management of Chronic Issues    6 month f/u    Laceration    Has a cut on his right hand pinky finger. Happened on 03/24/24    HPI Joshua Gilmore is a 23 y.o. male with past medical history of autism spectrum disorder and alopecia who presents for f/u of his chronic medical conditions.  He has been followed by Psychiatrist.  He has a history of autism spectrum disorder.  He also has adjustment disorder with mixed anxiety and depression, and is on Wellbutrin  currently.  He currently works in Art therapist.  He has chronic bilateral ear pain and discomfort.  He has seen [facility] specialist for it and was told of dry skin in ears. Denies any hearing loss, tinnitus or balance problem.  Denies any ear discharge.  Denies any fever, chills, nausea, vomiting, nasal congestion or sore throat.  He reports metal cut injury on the right pinky finger on 03/24/24 while at work.  Wound is healing well, skin is approximated currently.  He is up-to-date with Tdap vaccine (09/2019).  Past Medical History:  Diagnosis Date   ADHD (attention deficit hyperactivity disorder)    Allergic rhinitis    Autism spectrum disorder    Generalized anxiety disorder 05/07/2013    History reviewed. No pertinent surgical history.  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  Tobacco Use    Smoking status: Never   Smokeless tobacco: Never  Vaping Use   Vaping status: Never Used  Substance and Sexual Activity   Alcohol use: Never   Drug use: Never   Sexual activity: Not Currently  Other Topics Concern   Not on file  Social History Narrative   Lives with mom,sister away at college   Social Drivers of Health   Financial Resource Strain: Low Risk  (03/26/2024)   Overall Financial Resource Strain (CARDIA)    Difficulty of Paying Living Expenses: Not hard at all  Food Insecurity: No Food Insecurity (03/26/2024)   Hunger Vital Sign    Worried About Running Out of Food in the Last Year: Never true    Ran Out of Food in the Last Year: Never true  Transportation Needs: No Transportation Needs (03/26/2024)   PRAPARE - Administrator, Civil Service (Medical): No    Lack of Transportation (Non-Medical): No  Physical Activity: Inactive (03/26/2024)   Exercise Vital Sign    Days of Exercise per Week: 0 days    Minutes of Exercise per Session: Not on file  Stress: No Stress Concern Present (03/26/2024)   [employer] of Occupational Health - Occupational Stress Questionnaire    Feeling of Stress: Only a little  Social Connections: Moderately Isolated (03/26/2024)   Social Connection and Isolation Panel    Frequency of Communication  with Friends and Family: Three times a week    Frequency of Social Gatherings with Friends and Family: Twice a week    Attends Religious Services: 1 to 4 times per year    Active Member of [employer] or Organizations: No    Attends Engineer, structural: Not on file    Marital Status: Never married  Intimate Partner Violence: Not At Risk (04/23/2020)   Humiliation, Afraid, Rape, and Kick questionnaire    Fear of Current or Ex-Partner: No    Emotionally Abused: No    Physically Abused: No    Sexually Abused: No    Outpatient Medications Prior to Visit  Medication Sig Dispense Refill   buPROPion  (WELLBUTRIN  XL) 300 MG 24 hr tablet Take  1 tablet (300 mg total) by mouth daily. 30 tablet 2   clotrimazole -betamethasone  (LOTRISONE ) cream Apply 1 Application topically 2 (two) times daily. 60 g 1   cyclobenzaprine  (FLEXERIL ) 5 MG tablet Take 1 tablet (5 mg total) by mouth 2 (two) times daily as needed for muscle spasms. 30 tablet 0   fluticasone  (FLONASE ) 50 MCG/ACT nasal spray Place 1 spray into both nostrils 2 (two) times daily. 16 g 2   ibuprofen  (ADVIL ) 800 MG tablet Take 1 tablet (800 mg total) by mouth every 8 (eight) hours as needed. 30 tablet 0   No facility-administered medications prior to visit.    No Known Allergies  ROS Review of Systems  Constitutional:  Negative for chills and fever.  HENT:  Positive for ear pain. Negative for congestion, ear discharge, sinus pressure and sinus pain.   Respiratory:  Negative for cough and shortness of breath.   Cardiovascular:  Negative for chest pain and palpitations.  Gastrointestinal:  Negative for constipation, diarrhea and vomiting.  Genitourinary:  Negative for dysuria and hematuria.  Musculoskeletal:  Negative for neck pain and neck stiffness.  Skin:  Negative for rash.  Neurological:  Negative for dizziness and weakness.  Psychiatric/Behavioral:  Negative for dysphoric mood. The patient is not nervous/anxious.       Objective:    Physical Exam Vitals reviewed.  Constitutional:      General: He is not in acute distress.    Appearance: He is not diaphoretic.  HENT:     Head: Normocephalic and atraumatic.     Right Ear: External ear normal.     Left Ear: External ear normal.     Ears:     Comments: Left ear canal mildly erythematous No tenderness over tragal area    Nose: Nose normal.     Mouth/Throat:     Mouth: Mucous membranes are moist.  Eyes:     General: No scleral icterus.    Extraocular Movements: Extraocular movements intact.     Pupils: Pupils are equal, round, and reactive to light.  Cardiovascular:     Rate and Rhythm: Normal rate and regular  rhythm.     Heart sounds: Normal heart sounds. No murmur heard. Musculoskeletal:     Cervical back: Neck supple. No tenderness.  Skin:    General: Skin is warm.     Findings: No rash.     Comments: Laceration over right little finger - about 3 cm in length, healing well  Neurological:     General: No focal deficit present.     Mental Status: He is alert and oriented to person, place, and time.     Sensory: No sensory deficit.     Motor: No weakness.  Psychiatric:  Mood and Affect: Mood normal.        Speech: Speech is delayed.        Behavior: Behavior is slowed.     Ht 6' (1.829 m)   BMI 19.50 kg/m  Wt Readings from Last 3 Encounters:  11/21/23 143 lb 12.8 oz (65.2 kg)  08/25/23 141 lb (64 kg)  04/28/23 141 lb (64 kg)    Lab Results  Component Value Date   TSH 1.700 08/30/2023   Lab Results  Component Value Date   WBC 5.4 08/30/2023   HGB 14.5 08/30/2023   HCT 46.7 08/30/2023   MCV 84 08/30/2023   PLT 344 08/30/2023   Lab Results  Component Value Date   NA 141 08/30/2023   K 4.0 08/30/2023   CO2 27 08/30/2023   GLUCOSE 85 08/30/2023   BUN 10 08/30/2023   CREATININE 0.94 08/30/2023   BILITOT 1.3 (H) 08/30/2023   ALKPHOS 61 08/30/2023   AST 16 08/30/2023   ALT 11 08/30/2023   PROT 7.2 08/30/2023   ALBUMIN 4.9 08/30/2023   CALCIUM 9.7 08/30/2023   EGFR 118 08/30/2023   Lab Results  Component Value Date   CHOL 155 08/30/2023   Lab Results  Component Value Date   HDL 69 08/30/2023   Lab Results  Component Value Date   LDLCALC 65 08/30/2023   Lab Results  Component Value Date   TRIG 120 08/30/2023   Lab Results  Component Value Date   CHOLHDL 2.2 08/30/2023   Lab Results  Component Value Date   HGBA1C 5.3 08/30/2023      Assessment & Plan:   Problem List Items Addressed This Visit    Problem List Items Addressed This Visit       Other   Autism spectrum disorder   Followed by psychiatry - Dr. Vickey Needs to have [facility]  therapy      Depression with anxiety   Well controlled with Wellbutrin  Likely has panic episodes/PTSD as well Followed by psychiatry - Dr. Vickey      Laceration of right little finger without foreign body without damage to nail - Primary   Appears to be healing well Mupirocin  ointment for secondary bacterial PPx Advised to keep area clean and dry      Relevant Medications   mupirocin  ointment (BACTROBAN ) 2 %   Other Visit Diagnoses       Encounter for immunization       Relevant Orders   Flu vaccine trivalent PF, 6mos and older(Flulaval,Afluria,Fluarix,Fluzone) (Completed)           Meds ordered this encounter  Medications   mupirocin  ointment (BACTROBAN ) 2 %    Sig: Apply 1 Application topically 2 (two) times daily.    Dispense:  22 g    Refill:  0    Follow-up: Return in about 6 months (around 09/23/2024).    Suzzane MARLA Blanch, MD

## 2024-03-26 NOTE — Patient Instructions (Addendum)
 Please apply Mupirocin  ointment around cut injury.  Please apply Cortisol cream over itching area (insect bite). Please take Benadryl as needed for itching.

## 2024-03-26 NOTE — Assessment & Plan Note (Signed)
 Followed by psychiatry - Dr. Vickey Needs to have [facility] therapy

## 2024-03-26 NOTE — Assessment & Plan Note (Signed)
 Well controlled with Wellbutrin  Likely has panic episodes/PTSD as well Followed by psychiatry - Dr. Hisada

## 2024-03-27 DIAGNOSIS — S61216A Laceration without foreign body of right little finger without damage to nail, initial encounter: Secondary | ICD-10-CM | POA: Insufficient documentation

## 2024-03-27 NOTE — Assessment & Plan Note (Signed)
 Appears to be healing well Mupirocin  ointment for secondary bacterial PPx Advised to keep area clean and dry

## 2024-03-28 ENCOUNTER — Encounter: Payer: Self-pay | Admitting: Psychiatry

## 2024-03-28 ENCOUNTER — Telehealth (INDEPENDENT_AMBULATORY_CARE_PROVIDER_SITE_OTHER): Admitting: Psychiatry

## 2024-03-28 DIAGNOSIS — F70 Mild intellectual disabilities: Secondary | ICD-10-CM | POA: Diagnosis not present

## 2024-03-28 DIAGNOSIS — F840 Autistic disorder: Secondary | ICD-10-CM | POA: Diagnosis not present

## 2024-03-28 DIAGNOSIS — F4323 Adjustment disorder with mixed anxiety and depressed mood: Secondary | ICD-10-CM

## 2024-03-28 DIAGNOSIS — F902 Attention-deficit hyperactivity disorder, combined type: Secondary | ICD-10-CM

## 2024-03-28 MED ORDER — BUPROPION HCL ER (XL) 300 MG PO TB24: 300.0000 mg | ORAL_TABLET | Freq: Every day | ORAL | 2 refills | Status: AC

## 2024-03-28 NOTE — Patient Instructions (Signed)
 Continue bupropion  300 mg daily  Next appointment: 11/5 at 1 20

## 2024-05-19 NOTE — Progress Notes (Deleted)
 BH MD/PA/NP OP Progress Note  05/19/2024 10:47 AM Joshua Gilmore  MRN:  [id number]  Chief Complaint: No chief complaint on file.  HPI: ***   Substance use   Tobacco Alcohol Other substances/  Current   denies denies  Past   Only once in 2023- did not like the taste denies  Past Treatment            Visit Diagnosis: No diagnosis found.  Past Psychiatric History: Please see initial evaluation for full details. I have reviewed the history. No updates at this time.     Past Medical History:  Past Medical History:  Diagnosis Date   ADHD (attention deficit hyperactivity disorder)    Allergic rhinitis    Autism spectrum disorder    Generalized anxiety disorder 05/07/2013   No past surgical history on file.  Family Psychiatric History: Please see initial evaluation for full details. I have reviewed the history. No updates at this time.     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  Tobacco Use   Smoking status: Never   Smokeless tobacco: Never  Vaping Use   Vaping status: Never Used  Substance and Sexual Activity   Alcohol use: Never   Drug use: Never   Sexual activity: Not Currently  Other Topics Concern   Not on file  Social History Narrative   Lives with mom,sister away at college   Social Drivers of Health   Financial Resource Strain: Low Risk  (03/26/2024)   Overall Financial Resource Strain (CARDIA)    Difficulty of Paying Living Expenses: Not hard at all  Food Insecurity: No Food Insecurity  (03/26/2024)   Hunger Vital Sign    Worried About Running Out of Food in the Last Year: Never true    Ran Out of Food in the Last Year: Never true  Transportation Needs: No Transportation Needs (03/26/2024)   PRAPARE - Administrator, Civil Service (Medical): No    Lack of Transportation (Non-Medical): No  Physical Activity: Inactive (03/26/2024)   Exercise Vital Sign    Days of Exercise per Week: 0 days    Minutes of Exercise per Session: Not on file  Stress: No Stress Concern Present (03/26/2024)   [employer] of Occupational Health - Occupational Stress Questionnaire    Feeling of Stress: Only a little  Social Connections: Moderately Isolated (03/26/2024)   Social Connection and Isolation Panel    Frequency of Communication with Friends and Family: Three times a week    Frequency of Social Gatherings with Friends and Family: Twice a week    Attends Religious Services: 1 to 4 times per year    Active Member of [employer] or Organizations: No    Attends Engineer, Structural: Not on file    Marital Status: Never married    Allergies: No Known Allergies  Metabolic Disorder Labs: Lab Results  Component Value Date   HGBA1C 5.3 08/30/2023  No results found for: PROLACTIN Lab Results  Component Value Date   CHOL 155 08/30/2023   TRIG 120 08/30/2023   HDL 69 08/30/2023   CHOLHDL 2.2 08/30/2023   LDLCALC 65 08/30/2023   LDLCALC 87 05/02/2020   Lab Results  Component Value Date   TSH 1.700 08/30/2023   TSH 0.865 07/29/2021    Therapeutic Level Labs: No results found for: LITHIUM No results found for: VALPROATE No results found for: CBMZ  Current Medications: Current Outpatient Medications  Medication Sig Dispense Refill   buPROPion  (WELLBUTRIN  XL) 300 MG 24 hr tablet Take 1 tablet (300 mg total) by mouth daily. 30 tablet 2   mupirocin  ointment (BACTROBAN ) 2 % Apply 1 Application topically 2 (two) times daily. 22 g 0   No current  facility-administered medications for this visit.     Musculoskeletal: Strength & Muscle Tone: N/A Gait & Station: N/A Patient leans: N/A  Psychiatric Specialty Exam: Review of Systems  There were no vitals taken for this visit.There is no height or weight on file to calculate BMI.  General Appearance: {Appearance:22683}  Eye Contact:  {BHH EYE CONTACT:22684}  Speech:  Clear and Coherent  Volume:  Normal  Mood:  {BHH MOOD:22306}  Affect:  {Affect (PAA):22687}  Thought Process:  Coherent  Orientation:  Full (Time, Place, and Person)  Thought Content: Logical   Suicidal Thoughts:  {ST/HT (PAA):22692}  Homicidal Thoughts:  {ST/HT (PAA):22692}  Memory:  Immediate;   Good  Judgement:  {Judgement (PAA):22694}  Insight:  {Insight (PAA):22695}  Psychomotor Activity:  Normal  Concentration:  Concentration: Good and Attention Span: Good  Recall:  Good  Fund of Knowledge: Good  Language: Good  Akathisia:  No  Handed:  Right  AIMS (if indicated): not done  Assets:  Communication Skills Desire for Improvement  ADL's:  Intact  Cognition: WNL  Sleep:  {BHH GOOD/FAIR/POOR:22877}   Screenings: GAD-7    Flowsheet Row Office Visit from 03/26/2024 in [facility] Office Visit from 11/21/2023 in [facility] Office Visit from 02/14/2023 in [facility] Office Visit from 12/29/2022 in [facility] Primary [facility] Counselor from 11/29/2022 in [facility] at [place]  Total GAD-7 Score 0 0 3 1 8    PHQ2-9    Flowsheet Row Office Visit from 03/26/2024 in [facility] Office Visit from 11/21/2023 in [facility] Care Office Visit from 08/25/2023 in [facility] Office Visit from 04/28/2023 in [facility] Primary Care Office Visit from 02/14/2023 in [facility]  PHQ-2 Total  Score 0 0 0 0 0  PHQ-9 Total Score 0 0 -- -- --   Flowsheet Row UC from 06/08/2023 in [facility] [place] Counselor from 11/29/2022 in [facility] at [place] ED from 09/11/2022 in [facility]  C-SSRS RISK CATEGORY No Risk No Risk No Risk     Assessment and Plan:  Joshua Gilmore is a 23 y.o. year old male with a history of autism, ADHD, mild intellectual developmental disorder, who presents for follow up appointment for below.    1. Adjustment disorder with mixed anxiety and depressed mood Other stressors include: loneliness   History: non adherence to medication at times   Although he reports occasional down mood and frustration related to work, those are self limited, and he denies concern otherwise.  He started to  enjoy doing art crafts instead of video games. Will continue current dose of bupropion  to target depressed mood.    2. Attention deficit hyperactivity disorder (ADHD), combined type - received neuropsych testing at [facility] 10/2019     Overall stable.  Will continue current dose of bupropion  to target ADHD.    3. Autism spectrum disorder 3. Intellectual developmental disorder, mild - received neuropsych testing at [facility] During visits since initial evaluation, he has not displayed symptoms to be concerned of psychotic disorder except that he had paranoia/anxiety and some tangential thought process on initial visit, which be more attributable to anxiety. No known history of schizophrenia, and no safety concern of aggression reported by his caregiver.      Plan Continue bupropion  300 mg daily  Next appointment: 11/5 at 1 20, video   The patient demonstrates the following risk factors for suicide: Chronic risk factors for suicide include: psychiatric disorder of autism, ADHD . Acute risk factors for suicide include: social withdrawal/isolation. Protective factors for this patient include:  positive social support. Considering these factors, the overall suicide risk at this point appears to be low. Patient is appropriate for outpatient follow up.     Collaboration of Care: Collaboration of Care: {BH OP Collaboration of Care:21014065}  Patient/Guardian was advised Release of Information must be obtained prior to any record release in order to collaborate their care with an outside provider. Patient/Guardian was advised if they have not already done so to contact the registration department to sign all necessary forms in order for us  to release information regarding their care.   Consent: Patient/Guardian gives verbal consent for treatment and assignment of benefits for services provided during this visit. Patient/Guardian expressed understanding and agreed to proceed.    Katheren Sleet, MD 05/19/2024, 10:47 AM

## 2024-05-23 ENCOUNTER — Telehealth: Admitting: Psychiatry

## 2024-06-03 NOTE — Progress Notes (Signed)
 Virtual Visit via Video Note  I connected with Joshua Gilmore on 06/13/24 at  8:00 AM EST by a video enabled telemedicine application and verified that I am speaking with the correct person using two identifiers.  Location: Patient: home Provider: home office Persons participated in the visit- patient, provider    I discussed the limitations of evaluation and management by telemedicine and the availability of in person appointments. The patient expressed understanding and agreed to proceed.    I discussed the assessment and treatment plan with the patient. The patient was provided an opportunity to ask questions and all were answered. The patient agreed with the plan and demonstrated an understanding of the instructions.   The patient was advised to call back or seek an in-person evaluation if the symptoms worsen or if the condition fails to improve as anticipated.    Katheren Sleet, MD    [facility] MD/PA/NP OP Progress Note  06/13/2024 8:22 AM Joshua Gilmore  MRN:  [id number]  Chief Complaint:  Chief Complaint  Patient presents with   Follow-up   HPI:  This is a follow-up appointment for adjustment disorder and ADHD.  He states that he is working first shift.  He does not like it as much as a tends to be busy.  He also states that there is 1 employee who gets on his nerves.  He pushed him away.  He backed off and consulted his supervisor.  However, he feels limited support from her.  He agrees to continue to leave the situation if similar things were to happen, and consult the appropriate supervisor.  He states that his mood is even tempered otherwise.  He denies irritability.  He broke dishes by accident when he was stressed.  He also reports concern of him being fired if he were to stay on the first shift.  He agrees to discuss with supervisor to find out the best options for him.  Although he has not met his father, he feels at peace.  He states that his father is a narcissist and he  would never change.  He denies feeling depressed or anxiety.  His sleep has been better compared to before, which she attributes to the better sleep schedule.  He sleeps 7 hours.  He denies change in appetite.  He is planning to go to church and see her grandmother on Thanksgiving.  He denies SI, HI, hallucinations.  He agrees with the plans as outlined below.   Substance use   Tobacco Alcohol Other substances/  Current   denies denies  Past   Only once in 2023- did not like the taste denies  Past Treatment            Visit Diagnosis:    ICD-10-CM   1. Adjustment disorder with mixed anxiety and depressed mood  F43.23     2. Attention deficit hyperactivity disorder (ADHD), combined type  F90.2       Past Psychiatric History: Please see initial evaluation for full details. I have reviewed the history. No updates at this time.     Past Medical History:  Past Medical History:  Diagnosis Date   ADHD (attention deficit hyperactivity disorder)    Allergic rhinitis    Autism spectrum disorder    Generalized anxiety disorder 05/07/2013   History reviewed. No pertinent surgical history.  Family Psychiatric History: Please see initial evaluation for full details. I have reviewed the history. No updates at this time.     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  Tobacco Use   Smoking status: Never   Smokeless tobacco: Never  Vaping Use   Vaping status: Never Used  Substance and Sexual Activity   Alcohol use: Never    Drug use: Never   Sexual activity: Not Currently  Other Topics Concern   Not on file  Social History Narrative   Lives with mom,sister away at college   Social Drivers of Health   Financial Resource Strain: Low Risk  (03/26/2024)   Overall Financial Resource Strain (CARDIA)    Difficulty of Paying Living Expenses: Not hard at all  Food Insecurity: No Food Insecurity (03/26/2024)   Hunger Vital Sign    Worried About Running Out of Food in the Last Year: Never true    Ran Out of Food in the Last Year: Never true  Transportation Needs: No Transportation Needs (03/26/2024)   PRAPARE - Administrator, Civil Service (Medical): No    Lack of Transportation (Non-Medical): No  Physical Activity: Inactive (03/26/2024)   Exercise Vital Sign    Days of Exercise per Week: 0 days    Minutes of Exercise per Session: Not on file  Stress: No Stress Concern Present (03/26/2024)   [employer] of Occupational Health - Occupational Stress Questionnaire    Feeling of Stress: Only a little  Social Connections: Moderately Isolated (03/26/2024)   Social Connection and Isolation Panel    Frequency of Communication with Friends and Family: Three times a week    Frequency of Social Gatherings with Friends and Family: Twice a week    Attends Religious Services: 1 to 4 times per year    Active Member of Clubs or Organizations: No    Attends Engineer, Structural: Not on file    Marital Status: Never married    Allergies: No Known Allergies  Metabolic Disorder Labs: Lab Results  Component Value Date   HGBA1C 5.3 08/30/2023   No results found for: PROLACTIN Lab Results  Component Value Date   CHOL 155 08/30/2023   TRIG 120 08/30/2023   HDL 69 08/30/2023   CHOLHDL 2.2 08/30/2023   LDLCALC 65 08/30/2023   LDLCALC 87 05/02/2020   Lab Results  Component Value Date   TSH 1.700 08/30/2023   TSH 0.865 07/29/2021    Therapeutic Level Labs: No results found for: LITHIUM No  results found for: VALPROATE No results found for: CBMZ  Current Medications: Current Outpatient Medications  Medication Sig Dispense Refill   buPROPion  (WELLBUTRIN  XL) 300 MG 24 hr tablet Take 1 tablet (300 mg total) by mouth daily. 30 tablet 2   mupirocin  ointment (BACTROBAN ) 2 % Apply 1 Application topically 2 (two) times daily. 22 g 0   No current facility-administered medications for this visit.     Musculoskeletal: Strength & Muscle Tone: N/A Gait & Station: N/A Patient leans: N/A  Psychiatric Specialty Exam: Review of Systems  Psychiatric/Behavioral:  Negative for agitation,  behavioral problems, confusion, decreased concentration, dysphoric mood, hallucinations, self-injury, sleep disturbance and suicidal ideas. The patient is not nervous/anxious and is not hyperactive.   All other systems reviewed and are negative.   There were no vitals taken for this visit.There is no height or weight on file to calculate BMI.  General Appearance: Well Groomed  Eye Contact:  Good  Speech:  Clear and Coherent  Volume:  Normal  Mood:  good  Affect:  Appropriate, Congruent, and calm  Thought Process:  Coherent  Orientation:  Full (Time, Place, and Person)  Thought Content: Logical   Suicidal Thoughts:  No  Homicidal Thoughts:  No  Memory:  Immediate;   Good  Judgement:  Good  Insight:  Fair  Psychomotor Activity:  Normal  Concentration:  Concentration: Good and Attention Span: Good  Recall:  Good  Fund of Knowledge: Good  Language: Good  Akathisia:  No  Handed:  Right  AIMS (if indicated): not done  Assets:  Communication Skills Desire for Improvement  ADL's:  Intact  Cognition: WNL  Sleep:  Good   Screenings: GAD-7    Flowsheet Row Office Visit from 03/26/2024 in [facility] Office Visit from 11/21/2023 in [facility] Office Visit from 02/14/2023 in [facility] Office Visit from  12/29/2022 in [facility] Counselor from 11/29/2022 in [facility] [place]  Total GAD-7 Score 0 0 3 1 8    PHQ2-9    Flowsheet Row Office Visit from 03/26/2024 in [facility] Office Visit from 11/21/2023 in [facility] Office Visit from 08/25/2023 in [facility] Care Office Visit from 04/28/2023 in [facility] Office Visit from 02/14/2023 in [facility]  PHQ-2 Total Score 0 0 0 0 0  PHQ-9 Total Score 0 0 -- -- --   Flowsheet Row UC from 06/08/2023 in [facility] [place] Counselor from 11/29/2022 in [facility] at [place] ED from 09/11/2022 in [facility]  C-SSRS RISK CATEGORY No Risk No Risk No Risk     Assessment and Plan:  Joshua Gilmore is a 23 y.o. year old male with a history of autism, ADHD, mild intellectual developmental disorder, who presents for follow up appointment for below.    1. Adjustment disorder with mixed anxiety and depressed mood Other stressors include: loneliness   History: non adherence to medication at times   He denies any significant mood symptoms since the last visit except the stress at work.  Explored the way for behavioral activation.  Will continue current dose of bupropion  to target depressed mood.   2. Attention deficit hyperactivity disorder (ADHD), combined type - received neuropsych testing at [facility] 10/2019      Overall stable.  Will continue the current dose of bupropion  to target ADHD.    3. Autism spectrum disorder 3. Intellectual developmental disorder, mild - received neuropsych testing at [facility] During visits since initial evaluation, he has not displayed symptoms to be concerned of psychotic disorder except that he had paranoia/anxiety and some tangential thought process on  initial visit, which be more attributable to anxiety. No known history of schizophrenia, and no safety concern of aggression reported by his caregiver.      Plan Continue bupropion  300 mg daily  Next appointment: 2/18 at 8 am, video   The  patient demonstrates the following risk factors for suicide: Chronic risk factors for suicide include: psychiatric disorder of autism, ADHD . Acute risk factors for suicide include: social withdrawal/isolation. Protective factors for this patient include: positive social support. Considering these factors, the overall suicide risk at this point appears to be low. Patient is appropriate for outpatient follow up.     Collaboration of Care: Collaboration of Care: Other reviewed notes in Epic  Patient/Guardian was advised Release of Information must be obtained prior to any record release in order to collaborate their care with an outside provider. Patient/Guardian was advised if they have not already done so to contact the registration department to sign all necessary forms in order for us  to release information regarding their care.   Consent: Patient/Guardian gives verbal consent for treatment and assignment of benefits for services provided during this visit. Patient/Guardian expressed understanding and agreed to proceed.    Katheren Sleet, MD 06/13/2024, 8:22 AM

## 2024-06-13 ENCOUNTER — Encounter: Payer: Self-pay | Admitting: Psychiatry

## 2024-06-13 ENCOUNTER — Telehealth (INDEPENDENT_AMBULATORY_CARE_PROVIDER_SITE_OTHER): Admitting: Psychiatry

## 2024-06-13 DIAGNOSIS — F902 Attention-deficit hyperactivity disorder, combined type: Secondary | ICD-10-CM

## 2024-06-13 DIAGNOSIS — F4323 Adjustment disorder with mixed anxiety and depressed mood: Secondary | ICD-10-CM

## 2024-06-13 NOTE — Patient Instructions (Signed)
 Continue bupropion  300 mg daily  Next appointment: 2/18 at 8 am

## 2024-06-27 ENCOUNTER — Ambulatory Visit: Admitting: Internal Medicine

## 2024-06-27 VITALS — BP 108/74 | HR 78 | Ht 72.0 in | Wt 140.4 lb

## 2024-06-27 DIAGNOSIS — J069 Acute upper respiratory infection, unspecified: Secondary | ICD-10-CM | POA: Insufficient documentation

## 2024-06-27 MED ORDER — AMOXICILLIN-POT CLAVULANATE 875-125 MG PO TABS: 1.0000 | ORAL_TABLET | Freq: Two times a day (BID) | ORAL | 0 refills | Status: AC

## 2024-06-27 NOTE — Patient Instructions (Signed)
 Please start taking Augmentin  if your symptoms do not improve after 2 days.\  Please use Flonase  for nasal congestion/allergies.  Please perform warm water gargling for sore throat.

## 2024-06-27 NOTE — Assessment & Plan Note (Signed)
 Check flu, COVID and RSV test DayQuil/NyQuil as needed for nasal congestion Flonase  for nasal congestion/allergies Advised to perform warm water gargling Advised to start Augmentin  if symptoms do not start improving or worsen after 2 days

## 2024-06-27 NOTE — Progress Notes (Signed)
 Acute Office Visit  Subjective:    Patient ID: Joshua Gilmore, male    DOB: 04-23-2001, 23 y.o.   MRN: [id number]  Chief Complaint  Patient presents with   Nasal Congestion    Pt reports sx of nasal congestion, reports sore throat went away     HPI Patient is in today for complaint of nasal congestion, sinus pressure related headache and sore throat for the last 3 days.  He has tried taking DayQuil/NyQuil with mild relief.  He has also used Flonase  for nasal congestion.  Reports improvement in sore throat currently.  Denies any fever, chills, dyspnea or wheezing currently.  Past Medical History:  Diagnosis Date   ADHD (attention deficit hyperactivity disorder)    Allergic rhinitis    Autism spectrum disorder    Generalized anxiety disorder 05/07/2013    History reviewed. No pertinent surgical history.  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  Tobacco Use   Smoking status: Never   Smokeless tobacco: Never  Vaping Use   Vaping status: Never Used  Substance and Sexual Activity   Alcohol use: Never   Drug use: Never   Sexual activity: Not Currently  Other Topics Concern   Not on file  Social History Narrative   Lives with mom,sister away at college   Social Drivers of Health   Financial Resource Strain: Low Risk  (06/27/2024)   Overall Financial Resource Strain (CARDIA)    Difficulty of Paying Living Expenses: Not hard at all  Food Insecurity: No Food Insecurity (06/27/2024)   Hunger Vital Sign    Worried About Running Out of Food in the  Last Year: Never true    Ran Out of Food in the Last Year: Never true  Transportation Needs: No Transportation Needs (06/27/2024)   PRAPARE - Administrator, Civil Service (Medical): No    Lack of Transportation (Non-Medical): No  Physical Activity: Sufficiently Active (06/27/2024)   Exercise Vital Sign    Days of Exercise per Week: 5 days    Minutes of Exercise per Session: 30 min  Stress: No Stress Concern Present (06/27/2024)   [employer] of Occupational Health - Occupational Stress Questionnaire    Feeling of Stress: Not at all  Social Connections: Moderately Isolated (06/27/2024)   Social Connection and Isolation Panel    Frequency of Communication with Friends and Family: More than three times a week    Frequency of Social Gatherings with Friends and Family: Twice a week    Attends Religious Services: 1 to 4 times per year    Active Member of [employer] or Organizations: No    Attends Banker Meetings: Not on file    Marital Status: Never married  Intimate Partner Violence: Not At Risk (04/23/2020)   Humiliation, Afraid, Rape, and Kick questionnaire    Fear of Current or Ex-Partner: No    Emotionally Abused: No    Physically Abused: No    Sexually Abused: No  Outpatient Medications Prior to Visit  Medication Sig Dispense Refill   buPROPion  (WELLBUTRIN  XL) 300 MG 24 hr tablet Take 1 tablet (300 mg total) by mouth daily. 30 tablet 2   mupirocin  ointment (BACTROBAN ) 2 % Apply 1 Application topically 2 (two) times daily. 22 g 0   No facility-administered medications prior to visit.    No Known Allergies  Review of Systems  Constitutional:  Negative for chills and fever.  HENT:  Positive for congestion, sinus pressure and sore throat. Negative for ear discharge and sinus pain.   Respiratory:  Positive for cough. Negative for shortness of breath.   Cardiovascular:  Negative for chest pain and palpitations.  Gastrointestinal:  Negative for  constipation, diarrhea and vomiting.  Genitourinary:  Negative for dysuria and hematuria.  Musculoskeletal:  Negative for neck pain and neck stiffness.  Skin:  Negative for rash.  Neurological:  Negative for dizziness and weakness.  Psychiatric/Behavioral:  Negative for dysphoric mood. The patient is not nervous/anxious.        Objective:    Physical Exam Vitals reviewed.  Constitutional:      General: He is not in acute distress.    Appearance: He is not diaphoretic.  HENT:     Head: Normocephalic and atraumatic.     Right Ear: External ear normal.     Left Ear: External ear normal.     Ears:     Comments: Left ear canal mildly erythematous No tenderness over tragal area    Nose: Congestion present.     Mouth/Throat:     Mouth: Mucous membranes are moist.     Pharynx: Posterior oropharyngeal erythema present.  Eyes:     General: No scleral icterus.    Extraocular Movements: Extraocular movements intact.     Pupils: Pupils are equal, round, and reactive to light.  Cardiovascular:     Rate and Rhythm: Normal rate and regular rhythm.     Heart sounds: Normal heart sounds. No murmur heard. Pulmonary:     Breath sounds: Normal breath sounds. No wheezing or rales.  Musculoskeletal:     Cervical back: Neck supple. No tenderness.  Skin:    General: Skin is warm.     Findings: No rash.  Neurological:     General: No focal deficit present.     Mental Status: He is alert and oriented to person, place, and time.     Sensory: No sensory deficit.     Motor: No weakness.  Psychiatric:        Mood and Affect: Mood normal.        Speech: Speech is delayed.        Behavior: Behavior is slowed.     BP 108/74   Pulse 78   Ht 6' (1.829 m)   Wt 140 lb 6.4 oz (63.7 kg)   SpO2 97%   BMI 19.04 kg/m  Wt Readings from Last 3 Encounters:  06/27/24 140 lb 6.4 oz (63.7 kg)  11/21/23 143 lb 12.8 oz (65.2 kg)  08/25/23 141 lb (64 kg)        Assessment & Plan:   Problem List  Items Addressed This Visit       Respiratory   URTI (acute upper respiratory infection) - Primary   Check flu, COVID and RSV test DayQuil/NyQuil as needed for nasal congestion Flonase  for nasal congestion/allergies Advised to perform warm water gargling Advised to start Augmentin  if symptoms do not start improving or worsen after 2 days  Relevant Medications   amoxicillin -clavulanate (AUGMENTIN ) 875-125 MG tablet   Other Relevant Orders   COVID-19, Flu A+B and RSV     Meds ordered this encounter  Medications   amoxicillin -clavulanate (AUGMENTIN ) 875-125 MG tablet    Sig: Take 1 tablet by mouth 2 (two) times daily.    Dispense:  14 tablet    Refill:  0     Damacio Weisgerber MARLA Blanch, MD

## 2024-06-29 ENCOUNTER — Ambulatory Visit: Payer: Self-pay | Admitting: Internal Medicine

## 2024-06-29 LAB — COVID-19, FLU A+B AND RSV
Influenza A, NAA: NOT DETECTED
Influenza B, NAA: NOT DETECTED
RSV, NAA: NOT DETECTED
SARS-CoV-2, NAA: NOT DETECTED

## 2024-09-05 ENCOUNTER — Telehealth: Admitting: Psychiatry

## 2024-09-24 ENCOUNTER — Ambulatory Visit: Admitting: Internal Medicine

## 2024-09-25 ENCOUNTER — Ambulatory Visit: Admitting: Internal Medicine
